# Patient Record
Sex: Male | Born: 1985 | Race: Black or African American | Hispanic: No | Marital: Single | Smoking: Former smoker
Health system: Southern US, Community
[De-identification: ages and names within clinical notes are randomized; demographics above are authoritative.]

## PROBLEM LIST (undated history)

## (undated) DIAGNOSIS — J45909 Unspecified asthma, uncomplicated: Secondary | ICD-10-CM

---

## 2013-03-12 ENCOUNTER — Emergency Department (HOSPITAL_COMMUNITY)
Admission: EM | Admit: 2013-03-12 | Discharge: 2013-03-12 | Disposition: A | Discharge status: Home / Self Care | Payer: BC Managed Care – PPO | Source: Home / Self Care

## 2013-03-12 ENCOUNTER — Encounter (HOSPITAL_COMMUNITY): Payer: Self-pay | Admitting: Emergency Medicine

## 2013-03-12 DIAGNOSIS — J4531 Mild persistent asthma with (acute) exacerbation: Secondary | ICD-10-CM

## 2013-03-12 DIAGNOSIS — J45901 Unspecified asthma with (acute) exacerbation: Secondary | ICD-10-CM

## 2013-03-12 DIAGNOSIS — J209 Acute bronchitis, unspecified: Secondary | ICD-10-CM

## 2013-03-12 HISTORY — DX: Unspecified asthma, uncomplicated: J45.909

## 2013-03-12 MED ORDER — ALBUTEROL SULFATE (5 MG/ML) 0.5% IN NEBU
INHALATION_SOLUTION | RESPIRATORY_TRACT | Status: AC
  Filled 2013-03-12: qty 0.5

## 2013-03-12 MED ORDER — ALBUTEROL SULFATE HFA 108 (90 BASE) MCG/ACT IN AERS: 2.0000 | INHALATION_SPRAY | Freq: Four times a day (QID) | RESPIRATORY_TRACT | Status: DC | PRN

## 2013-03-12 MED ORDER — AZITHROMYCIN 250 MG PO TABS: ORAL_TABLET | ORAL | Status: DC

## 2013-03-12 MED ORDER — BUDESONIDE-FORMOTEROL FUMARATE 80-4.5 MCG/ACT IN AERO: 2.0000 | INHALATION_SPRAY | Freq: Two times a day (BID) | RESPIRATORY_TRACT | Status: DC

## 2013-03-12 MED ORDER — IPRATROPIUM BROMIDE 0.02 % IN SOLN
0.5000 mg | RESPIRATORY_TRACT | Status: DC
  Administered 2013-03-12: 0.5 mg via RESPIRATORY_TRACT

## 2013-03-12 MED ORDER — PREDNISONE (PAK) 10 MG PO TABS: 40.0000 mg | ORAL_TABLET | Freq: Every day | ORAL | Status: AC

## 2013-03-12 MED ORDER — PREDNISONE (PAK) 10 MG PO TABS: 40.0000 mg | ORAL_TABLET | Freq: Every day | ORAL | Status: DC

## 2013-03-12 MED ORDER — ALBUTEROL SULFATE (5 MG/ML) 0.5% IN NEBU
2.5000 mg | INHALATION_SOLUTION | RESPIRATORY_TRACT | Status: DC
  Administered 2013-03-12: 2.5 mg via RESPIRATORY_TRACT

## 2013-03-12 NOTE — ED Notes (Addendum)
Pt c/o asthma flare up onset 4 days Sxs include: productive cough, wheezing, SOB, fevers Denies: pain at the moment, v/n/d... Has not had a PCP in 10 yrs and no meds He is alert and oriented w/no signs of acute distress.

## 2013-03-12 NOTE — ED Provider Notes (Signed)
History     CSN: [id number]  Arrival date & time 03/12/13  1224   None     Chief Complaint  Patient presents with  . Asthma    (Consider location/radiation/quality/duration/timing/severity/associated sxs/prior treatment) Patient is a 27 y.o. male presenting with asthma.  Asthma Associated symptoms include chest pain and shortness of breath.   This is a 27 year old male with a past medical history of asthma who presents with one week of shortness of breath cough wheezing and chest pain. Patient states that he is also having some body aches and it is noted that he has a fever when checked here at urgent care. He states that he has not had an exacerbation of his asthma in over 2 years and does not have any medications at home. Past Medical History  Diagnosis Date  . Asthma     History reviewed. No pertinent past surgical history.  No family history on file.  History  Substance Use Topics  . Smoking status: Current Every Day Smoker -- 0.50 packs/day    Types: Cigarettes  . Smokeless tobacco: Not on file  . Alcohol Use: No      Review of Systems  Constitutional: Positive for fever and fatigue.  HENT: Positive for congestion.   Eyes: Negative.   Respiratory: Positive for cough, chest tightness and shortness of breath.   Cardiovascular: Positive for chest pain.  Gastrointestinal: Negative.   Genitourinary: Negative.   Musculoskeletal: Negative.   Skin: Negative.   Neurological: Negative.   Hematological: Negative.   Psychiatric/Behavioral: Negative.     Allergies  Review of patient's allergies indicates no known allergies.  Home Medications   Current Outpatient Rx  Name  Route  Sig  Dispense  Refill  . albuterol (PROVENTIL HFA;VENTOLIN HFA) 108 (90 BASE) MCG/ACT inhaler   Inhalation   Inhale 2 puffs into the lungs every 6 (six) hours as needed for wheezing.   1 Inhaler   2   . azithromycin (ZITHROMAX Z-PAK) 250 MG tablet      Take 500 mg on the first day  (2 tabs) and then 250 mg (one tab) every day after until complete.   6 each   0   . budesonide-formoterol (SYMBICORT) 80-4.5 MCG/ACT inhaler   Inhalation   Inhale 2 puffs into the lungs 2 (two) times daily.   1 Inhaler   12   . predniSONE (STERAPRED UNI-PAK) 10 MG tablet   Oral   Take 4 tablets (40 mg total) by mouth daily.   12 tablet   0     BP 122/78  Pulse 105  Temp(Src) 100.3 F (37.9 C) (Oral)  Resp 18  SpO2 97%  Physical Exam  Constitutional: He is oriented to person, place, and time. He appears well-developed and well-nourished.  HENT:  Head: Normocephalic and atraumatic.  Eyes: Conjunctivae are normal. Pupils are equal, round, and reactive to light.  Neck: Normal range of motion. Neck supple.  Cardiovascular: Normal rate and regular rhythm.   Pulmonary/Chest: Effort normal. No respiratory distress. He has wheezes. He has no rales.  Abdominal: Soft. Bowel sounds are normal.  Musculoskeletal: Normal range of motion.  Neurological: He is alert and oriented to person, place, and time.  Skin: Skin is warm and dry.  Psychiatric: He has a normal mood and affect. His behavior is normal.    ED Course  Procedures (including critical care time)  Labs Reviewed - No data to display No results found.   1. Asthma exacerbation, mild persistent  2. Acute bronchitis       MDM  I have ordered a DuoNeb for him to be received here.  I have ordered a Z-Pak for acute bronchitis. In addition I've ordered 3 days of prednisone 40 mg daily, Symbicort and an albuterol rescue inhaler.        Calvert Cantor, MD 03/12/13 1352

## 2013-03-27 ENCOUNTER — Encounter (HOSPITAL_COMMUNITY): Payer: Self-pay | Admitting: Emergency Medicine

## 2013-03-27 ENCOUNTER — Emergency Department (INDEPENDENT_AMBULATORY_CARE_PROVIDER_SITE_OTHER): Payer: BC Managed Care – PPO

## 2013-03-27 ENCOUNTER — Emergency Department (INDEPENDENT_AMBULATORY_CARE_PROVIDER_SITE_OTHER)
Admission: EM | Admit: 2013-03-27 | Discharge: 2013-03-27 | Disposition: A | Discharge status: Home / Self Care | Payer: BC Managed Care – PPO | Source: Home / Self Care | Attending: Emergency Medicine | Admitting: Emergency Medicine

## 2013-03-27 DIAGNOSIS — J45901 Unspecified asthma with (acute) exacerbation: Secondary | ICD-10-CM

## 2013-03-27 DIAGNOSIS — J4521 Mild intermittent asthma with (acute) exacerbation: Secondary | ICD-10-CM

## 2013-03-27 DIAGNOSIS — J209 Acute bronchitis, unspecified: Secondary | ICD-10-CM

## 2013-03-27 MED ORDER — ALBUTEROL SULFATE (5 MG/ML) 0.5% IN NEBU
INHALATION_SOLUTION | RESPIRATORY_TRACT | Status: AC
  Filled 2013-03-27: qty 1

## 2013-03-27 MED ORDER — ALBUTEROL SULFATE (5 MG/ML) 0.5% IN NEBU
2.5000 mg | INHALATION_SOLUTION | Freq: Once | RESPIRATORY_TRACT | Status: AC
  Administered 2013-03-27: 2.5 mg via RESPIRATORY_TRACT

## 2013-03-27 MED ORDER — GUAIFENESIN 100 MG/5ML PO LIQD: 100.0000 mg | ORAL | Status: DC | PRN

## 2013-03-27 MED ORDER — METHYLPREDNISOLONE 4 MG PO KIT: PACK | ORAL | Status: DC

## 2013-03-27 NOTE — ED Notes (Signed)
Pt c/o severe chest congestion. Nonproductive cough, runny nose and sneezing x 1 wk.  Denies fever and any other symptoms. Pt has a hx of asthma.   States that he has been using symbcort with mild relief.  Pt is sitting up right/alert with no signs of acute respiratory distress.

## 2013-03-29 NOTE — ED Provider Notes (Signed)
History    CSN: [id number] Arrival date & time 03/27/13  1656  First MD Initiated Contact with Patient 03/27/13 1828     Chief Complaint  Patient presents with  . Nasal Congestion    chest congestion x 1 wk. cough nonproductive. denies any other symptoms.    (Consider location/radiation/quality/duration/timing/severity/associated sxs/prior Treatment) HPI  27 yo bm presented with nasal/chest congestion x one week.  Has hx of asthma and was seen int the urgent care a couple of weeks ago and treated for bronchitis.  States that he did get better but was around a sick coworker and began having a cough, wheezing as well. Denies cp, sob, fever, chills.  Cough is productive with clear sputum.   Past Medical History  Diagnosis Date  . Asthma    History reviewed. No pertinent past surgical history. History reviewed. No pertinent family history. History  Substance Use Topics  . Smoking status: Current Every Day Smoker -- 0.50 packs/day    Types: Cigarettes  . Smokeless tobacco: Not on file  . Alcohol Use: No    Review of Systems  Constitutional: Negative.   HENT: Positive for congestion, rhinorrhea and postnasal drip. Negative for ear pain.   Eyes: Negative.   Respiratory: Positive for cough, chest tightness and wheezing.   Cardiovascular: Negative.   Gastrointestinal: Negative.   Endocrine: Negative.   Genitourinary: Negative.   Musculoskeletal: Negative.   Skin: Negative.   Neurological: Negative.   Hematological: Negative.   Psychiatric/Behavioral: Negative.     Allergies  Review of patient's allergies indicates no known allergies.  Home Medications   Current Outpatient Rx  Name  Route  Sig  Dispense  Refill  . budesonide-formoterol (SYMBICORT) 80-4.5 MCG/ACT inhaler   Inhalation   Inhale 2 puffs into the lungs 2 (two) times daily.   1 Inhaler   12   . albuterol (PROVENTIL HFA;VENTOLIN HFA) 108 (90 BASE) MCG/ACT inhaler   Inhalation   Inhale 2 puffs into the  lungs every 6 (six) hours as needed for wheezing.   1 Inhaler   2   . azithromycin (ZITHROMAX Z-PAK) 250 MG tablet      Take 500 mg on the first day (2 tabs) and then 250 mg (one tab) every day after until complete.   6 each   0   . guaiFENesin (ROBITUSSIN) 100 MG/5ML liquid   Oral   Take 5-10 mLs (100-200 mg total) by mouth every 4 (four) hours as needed for cough.   60 mL   0   . methylPREDNISolone (MEDROL, PAK,) 4 MG tablet      follow package directions   21 tablet   0    BP 139/96  Pulse 95  Temp(Src) 98.9 F (37.2 C) (Oral)  Resp 22  SpO2 97% Physical Exam  Constitutional: He is oriented to person, place, and time. He appears well-nourished.  HENT:  Head: Normocephalic and atraumatic.  Eyes: EOM are normal. Pupils are equal, round, and reactive to light.  Neck: Normal range of motion.  Cardiovascular: Normal rate, regular rhythm and normal heart sounds.   Pulmonary/Chest: No respiratory distress. He has wheezes.  Abdominal: Soft. Bowel sounds are normal.  Musculoskeletal: Normal range of motion.  Neurological: He is alert and oriented to person, place, and time.  Skin: Skin is warm.  Psychiatric: He has a normal mood and affect.    ED Course  Procedures (including critical care time) Labs Reviewed - No data to display Dg Chest 2 View  03/27/2013   *RADIOLOGY REPORT*  Clinical Data: Cough and congestion  CHEST - 2 VIEW  Comparison: None.  Findings: Heart size is normal.  Mediastinal shadows are normal. Lungs are clear.  No effusions.  No bony abnormalities.  IMPRESSION: Normal chest   Original Report Authenticated By: Paulina Fusi, M.D.   1. Asthma exacerbation, mild intermittent     MDM  Patient was given a albuterol neb treatment today.  Stated that he felt much better after.  Will continue using inhalers that urgent care provider gave at last visit.  Given medrol dose pack to use as directed.  Advised that he must get established with a primary care  physician soon.  Will begin looking next week.  F/u with [place] if symptoms worsen or go the the ED.  Voices understanding.    Meds ordered this encounter  Medications  . albuterol (PROVENTIL) (5 MG/ML) 0.5% nebulizer solution 2.5 mg    Sig:   . methylPREDNISolone (MEDROL, PAK,) 4 MG tablet    Sig: follow package directions    Dispense:  21 tablet    Refill:  0  . guaiFENesin (ROBITUSSIN) 100 MG/5ML liquid    Sig: Take 5-10 mLs (100-200 mg total) by mouth every 4 (four) hours as needed for cough.    Dispense:  60 mL    Refill:  0    Zonia Kief, PA-C 03/29/13 1010

## 2013-03-29 NOTE — ED Provider Notes (Signed)
Medical screening examination/treatment/procedure(s) were performed by non-physician practitioner and as supervising physician I was immediately available for consultation/collaboration.  Sadiya Durand, M.D.  Neytiri Asche C Naya Ilagan, MD 03/29/13 2155 

## 2017-06-19 ENCOUNTER — Encounter (HOSPITAL_COMMUNITY): Payer: Self-pay | Admitting: Emergency Medicine

## 2017-06-19 ENCOUNTER — Ambulatory Visit (HOSPITAL_COMMUNITY)
Admission: EM | Admit: 2017-06-19 | Discharge: 2017-06-19 | Disposition: A | Discharge status: Home / Self Care | Payer: 59 | Attending: Family Medicine | Admitting: Family Medicine

## 2017-06-19 DIAGNOSIS — J4521 Mild intermittent asthma with (acute) exacerbation: Secondary | ICD-10-CM

## 2017-06-19 MED ORDER — PREDNISONE 20 MG PO TABS: ORAL_TABLET | ORAL | 0 refills | Status: DC

## 2017-06-19 MED ORDER — ALBUTEROL SULFATE HFA 108 (90 BASE) MCG/ACT IN AERS: 2.0000 | INHALATION_SPRAY | Freq: Four times a day (QID) | RESPIRATORY_TRACT | 11 refills | Status: AC | PRN

## 2017-06-19 MED ORDER — BUDESONIDE-FORMOTEROL FUMARATE 80-4.5 MCG/ACT IN AERO: 2.0000 | INHALATION_SPRAY | Freq: Two times a day (BID) | RESPIRATORY_TRACT | 12 refills | Status: DC

## 2017-06-19 NOTE — Discharge Instructions (Signed)
Follow up with a primary care provider.  

## 2017-06-19 NOTE — ED Provider Notes (Signed)
  [facility]   [id number] 06/19/17 Arrival Time: 1106   SUBJECTIVE:  Michael Haynes is a 31 y.o. male who presents to the urgent care with complaint of upper respiratory symptoms for 4 days.  He is a known asthmatic and has run out of his medicine.  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 He developed a cough followed by scratchy throat and now wheezing.     Past Medical History:  Diagnosis Date  . Asthma    No family history on file. Social History   Social History  . Marital status: Single    Spouse name: N/A  . Number of children: N/A  . Years of education: N/A   Occupational History  . Not on file.   Social History Main Topics  . Smoking status: Current Every Day Smoker    Packs/day: 0.50    Types: Cigarettes  . Smokeless tobacco: Not on file  . Alcohol use No  . Drug use: No  . Sexual activity: Yes    Birth control/ protection: Condom   Other Topics Concern  . Not on file   Social History Narrative  . No narrative on file   Current Meds  Medication Sig  . [DISCONTINUED] guaiFENesin (MUCINEX) 600 MG 12 hr tablet Take by mouth 2 (two) times daily.   No Known Allergies    ROS: As per HPI, remainder of ROS negative.   OBJECTIVE:   Vitals:   06/19/17 1212  BP: (!) 149/82  Pulse: (!) 108  Resp: 20  Temp: 98 F (36.7 C)  TempSrc: Oral  SpO2: 100%     General appearance: alert; no distress Eyes: PERRL; EOMI; conjunctiva normal HENT: normocephalic; atraumatic; TMs normal, canal normal, external ears normal without trauma; nasal mucosa normal; oral mucosa normal Neck: supple Lungs: Wheezes in all lung fields on expiration Heart: regular rate and rhythm Back: no CVA tenderness Extremities: no cyanosis or edema; symmetrical with no gross deformities Skin: warm and dry Neurologic: normal gait; grossly normal Psychological: alert and cooperative; normal mood and  affect      Labs:  No results found for this or any previous visit.  Labs Reviewed - No data to display  No results found.     ASSESSMENT & PLAN:  1. Mild intermittent asthma with acute exacerbation     Meds ordered this encounter  Medications  . DISCONTD: guaiFENesin (MUCINEX) 600 MG 12 hr tablet    Sig: Take by mouth 2 (two) times daily.  . budesonide-formoterol (SYMBICORT) 80-4.5 MCG/ACT inhaler    Sig: Inhale 2 puffs into the lungs 2 (two) times daily.    Dispense:  1 Inhaler    Refill:  12  . albuterol (PROVENTIL HFA;VENTOLIN HFA) 108 (90 Base) MCG/ACT inhaler    Sig: Inhale 2 puffs into the lungs every 6 (six) hours as needed for wheezing.    Dispense:  1 Inhaler    Refill:  11  . predniSONE (DELTASONE) 20 MG tablet    Sig: Two daily with food    Dispense:  10 tablet    Refill:  0    Reviewed expectations re: course of current medical issues. Questions answered. Outlined signs and symptoms indicating need for more acute intervention. Patient verbalized understanding. After Visit Summary given.    Procedures:      Elvina Sidle, MD 06/19/17 1232

## 2017-06-19 NOTE — ED Triage Notes (Signed)
Sinus congestion and chest congestion that started on Saturday.  Patient has a cough, patient has sinus drainage

## 2019-05-02 ENCOUNTER — Other Ambulatory Visit: Payer: Self-pay

## 2019-05-02 ENCOUNTER — Inpatient Hospital Stay (HOSPITAL_COMMUNITY)
Admission: EM | Admit: 2019-05-02 | Discharge: 2019-05-05 | DRG: 292 | Disposition: A | Discharge status: Home / Self Care | Payer: 59 | Attending: Family Medicine | Admitting: Family Medicine

## 2019-05-02 ENCOUNTER — Encounter (HOSPITAL_COMMUNITY): Payer: Self-pay

## 2019-05-02 ENCOUNTER — Emergency Department (HOSPITAL_COMMUNITY): Payer: 59

## 2019-05-02 DIAGNOSIS — E876 Hypokalemia: Secondary | ICD-10-CM | POA: Diagnosis present

## 2019-05-02 DIAGNOSIS — I428 Other cardiomyopathies: Secondary | ICD-10-CM | POA: Diagnosis present

## 2019-05-02 DIAGNOSIS — J90 Pleural effusion, not elsewhere classified: Secondary | ICD-10-CM

## 2019-05-02 DIAGNOSIS — I272 Pulmonary hypertension, unspecified: Secondary | ICD-10-CM | POA: Diagnosis not present

## 2019-05-02 DIAGNOSIS — J45909 Unspecified asthma, uncomplicated: Secondary | ICD-10-CM | POA: Diagnosis present

## 2019-05-02 DIAGNOSIS — Z23 Encounter for immunization: Secondary | ICD-10-CM

## 2019-05-02 DIAGNOSIS — I2721 Secondary pulmonary arterial hypertension: Secondary | ICD-10-CM | POA: Diagnosis present

## 2019-05-02 DIAGNOSIS — J9811 Atelectasis: Secondary | ICD-10-CM | POA: Diagnosis present

## 2019-05-02 DIAGNOSIS — I248 Other forms of acute ischemic heart disease: Secondary | ICD-10-CM | POA: Diagnosis present

## 2019-05-02 DIAGNOSIS — Z713 Dietary counseling and surveillance: Secondary | ICD-10-CM

## 2019-05-02 DIAGNOSIS — R609 Edema, unspecified: Secondary | ICD-10-CM | POA: Diagnosis not present

## 2019-05-02 DIAGNOSIS — Z6841 Body Mass Index (BMI) 40.0 and over, adult: Secondary | ICD-10-CM

## 2019-05-02 DIAGNOSIS — D649 Anemia, unspecified: Secondary | ICD-10-CM | POA: Diagnosis present

## 2019-05-02 DIAGNOSIS — Z7289 Other problems related to lifestyle: Secondary | ICD-10-CM

## 2019-05-02 DIAGNOSIS — R0600 Dyspnea, unspecified: Secondary | ICD-10-CM | POA: Diagnosis not present

## 2019-05-02 DIAGNOSIS — R0602 Shortness of breath: Secondary | ICD-10-CM

## 2019-05-02 DIAGNOSIS — Z87891 Personal history of nicotine dependence: Secondary | ICD-10-CM

## 2019-05-02 DIAGNOSIS — Z8249 Family history of ischemic heart disease and other diseases of the circulatory system: Secondary | ICD-10-CM

## 2019-05-02 DIAGNOSIS — I5041 Acute combined systolic (congestive) and diastolic (congestive) heart failure: Secondary | ICD-10-CM

## 2019-05-02 DIAGNOSIS — Z79899 Other long term (current) drug therapy: Secondary | ICD-10-CM

## 2019-05-02 DIAGNOSIS — E66813 Obesity, class 3: Secondary | ICD-10-CM | POA: Diagnosis present

## 2019-05-02 DIAGNOSIS — Z720 Tobacco use: Secondary | ICD-10-CM

## 2019-05-02 DIAGNOSIS — R911 Solitary pulmonary nodule: Secondary | ICD-10-CM | POA: Diagnosis present

## 2019-05-02 DIAGNOSIS — Z20828 Contact with and (suspected) exposure to other viral communicable diseases: Secondary | ICD-10-CM | POA: Diagnosis present

## 2019-05-02 DIAGNOSIS — I110 Hypertensive heart disease with heart failure: Principal | ICD-10-CM | POA: Diagnosis present

## 2019-05-02 DIAGNOSIS — I509 Heart failure, unspecified: Secondary | ICD-10-CM

## 2019-05-02 LAB — CBC WITH DIFFERENTIAL/PLATELET
Abs Immature Granulocytes: 0.04 10*3/uL (ref 0.00–0.07)
Basophils Absolute: 0.1 10*3/uL (ref 0.0–0.1)
Basophils Relative: 1 %
Eosinophils Absolute: 0.1 10*3/uL (ref 0.0–0.5)
Eosinophils Relative: 1 %
HCT: 35.6 % — ABNORMAL LOW (ref 39.0–52.0)
Hemoglobin: 10.8 g/dL — ABNORMAL LOW (ref 13.0–17.0)
Immature Granulocytes: 0 %
Lymphocytes Relative: 14 %
Lymphs Abs: 1.2 10*3/uL (ref 0.7–4.0)
MCH: 25.7 pg — ABNORMAL LOW (ref 26.0–34.0)
MCHC: 30.3 g/dL (ref 30.0–36.0)
MCV: 84.8 fL (ref 80.0–100.0)
Monocytes Absolute: 0.7 10*3/uL (ref 0.1–1.0)
Monocytes Relative: 8 %
Neutro Abs: 6.8 10*3/uL (ref 1.7–7.7)
Neutrophils Relative %: 76 %
Platelets: 220 10*3/uL (ref 150–400)
RBC: 4.2 MIL/uL — ABNORMAL LOW (ref 4.22–5.81)
RDW: 17.4 % — ABNORMAL HIGH (ref 11.5–15.5)
WBC: 8.9 10*3/uL (ref 4.0–10.5)
nRBC: 0 % (ref 0.0–0.2)

## 2019-05-02 LAB — BASIC METABOLIC PANEL
Anion gap: 13 (ref 5–15)
BUN: 7 mg/dL (ref 6–20)
CO2: 25 mmol/L (ref 22–32)
Calcium: 8.6 mg/dL — ABNORMAL LOW (ref 8.9–10.3)
Chloride: 105 mmol/L (ref 98–111)
Creatinine, Ser: 0.95 mg/dL (ref 0.61–1.24)
GFR calc Af Amer: >60 mL/min (ref >60)
GFR calc non Af Amer: >60 mL/min (ref >60)
Glucose, Bld: 102 mg/dL — ABNORMAL HIGH (ref 70–99)
Potassium: 3.3 mmol/L — ABNORMAL LOW (ref 3.5–5.1)
Sodium: 143 mmol/L (ref 135–145)

## 2019-05-02 LAB — SARS CORONAVIRUS 2 BY RT PCR (HOSPITAL ORDER, PERFORMED IN ~~LOC~~ HOSPITAL LAB): SARS Coronavirus 2: NEGATIVE

## 2019-05-02 LAB — TROPONIN I (HIGH SENSITIVITY)
Troponin I (High Sensitivity): 19 ng/L — ABNORMAL HIGH (ref <18)
Troponin I (High Sensitivity): 19 ng/L — ABNORMAL HIGH (ref <18)

## 2019-05-02 LAB — BRAIN NATRIURETIC PEPTIDE: B Natriuretic Peptide: 439 pg/mL — ABNORMAL HIGH (ref 0.0–100.0)

## 2019-05-02 MED ORDER — LOSARTAN POTASSIUM 25 MG PO TABS
25.0000 mg | ORAL_TABLET | Freq: Every day | ORAL | Status: DC
  Administered 2019-05-02: 25 mg via ORAL
  Filled 2019-05-02: qty 1

## 2019-05-02 MED ORDER — SODIUM CHLORIDE 0.9% FLUSH
3.0000 mL | Freq: Two times a day (BID) | INTRAVENOUS | Status: DC
  Administered 2019-05-02 – 2019-05-05 (×6): 3 mL via INTRAVENOUS

## 2019-05-02 MED ORDER — CARVEDILOL 12.5 MG PO TABS
12.5000 mg | ORAL_TABLET | Freq: Two times a day (BID) | ORAL | Status: DC
  Administered 2019-05-02 – 2019-05-05 (×6): 12.5 mg via ORAL
  Filled 2019-05-02 (×7): qty 1

## 2019-05-02 MED ORDER — SODIUM CHLORIDE 0.9 % IV SOLN
500.0000 mg | Freq: Every day | INTRAVENOUS | Status: DC
  Administered 2019-05-03 – 2019-05-04 (×3): 500 mg via INTRAVENOUS
  Filled 2019-05-02 (×4): qty 500

## 2019-05-02 MED ORDER — POTASSIUM CHLORIDE CRYS ER 20 MEQ PO TBCR
20.0000 meq | EXTENDED_RELEASE_TABLET | Freq: Every day | ORAL | Status: DC
  Administered 2019-05-02 – 2019-05-05 (×4): 20 meq via ORAL
  Filled 2019-05-02 (×4): qty 1

## 2019-05-02 MED ORDER — ENOXAPARIN SODIUM 60 MG/0.6ML ~~LOC~~ SOLN
60.0000 mg | Freq: Every day | SUBCUTANEOUS | Status: DC
  Administered 2019-05-02 – 2019-05-04 (×3): 60 mg via SUBCUTANEOUS
  Filled 2019-05-02 (×3): qty 0.6

## 2019-05-02 MED ORDER — ACETAMINOPHEN 650 MG RE SUPP: 650.0000 mg | Freq: Four times a day (QID) | RECTAL | Status: DC | PRN

## 2019-05-02 MED ORDER — LABETALOL HCL 5 MG/ML IV SOLN
10.0000 mg | Freq: Once | INTRAVENOUS | Status: AC
  Administered 2019-05-02: 10 mg via INTRAVENOUS
  Filled 2019-05-02: qty 4

## 2019-05-02 MED ORDER — IOHEXOL 350 MG/ML SOLN
100.0000 mL | Freq: Once | INTRAVENOUS | Status: AC | PRN
  Administered 2019-05-02: 100 mL via INTRAVENOUS

## 2019-05-02 MED ORDER — SODIUM CHLORIDE (PF) 0.9 % IJ SOLN
INTRAMUSCULAR | Status: AC
  Administered 2019-05-02: 20:00:00
  Filled 2019-05-02: qty 50

## 2019-05-02 MED ORDER — FUROSEMIDE 10 MG/ML IJ SOLN
40.0000 mg | Freq: Once | INTRAMUSCULAR | Status: AC
  Administered 2019-05-02: 40 mg via INTRAVENOUS
  Filled 2019-05-02: qty 4

## 2019-05-02 MED ORDER — POTASSIUM CHLORIDE CRYS ER 20 MEQ PO TBCR
40.0000 meq | EXTENDED_RELEASE_TABLET | Freq: Once | ORAL | Status: AC
  Administered 2019-05-02: 40 meq via ORAL
  Filled 2019-05-02: qty 2

## 2019-05-02 MED ORDER — ACETAMINOPHEN 325 MG PO TABS: 650.0000 mg | ORAL_TABLET | Freq: Four times a day (QID) | ORAL | Status: DC | PRN

## 2019-05-02 MED ORDER — SODIUM CHLORIDE 0.9 % IV SOLN
1.0000 g | Freq: Every day | INTRAVENOUS | Status: DC
  Administered 2019-05-03 – 2019-05-04 (×3): 1 g via INTRAVENOUS
  Filled 2019-05-02 (×2): qty 1
  Filled 2019-05-02: qty 10
  Filled 2019-05-02: qty 1

## 2019-05-02 MED ORDER — POTASSIUM CHLORIDE CRYS ER 20 MEQ PO TBCR: 40.0000 meq | EXTENDED_RELEASE_TABLET | Freq: Once | ORAL | Status: DC

## 2019-05-02 MED ORDER — SODIUM CHLORIDE 0.9 % IV SOLN: 250.0000 mL | INTRAVENOUS | Status: DC | PRN

## 2019-05-02 MED ORDER — FUROSEMIDE 10 MG/ML IJ SOLN
40.0000 mg | Freq: Every day | INTRAMUSCULAR | Status: DC
  Administered 2019-05-02 – 2019-05-03 (×2): 40 mg via INTRAVENOUS
  Filled 2019-05-02: qty 4

## 2019-05-02 MED ORDER — SODIUM CHLORIDE 0.9% FLUSH: 3.0000 mL | INTRAVENOUS | Status: DC | PRN

## 2019-05-02 NOTE — H&P (Addendum)
TRH H&P    Patient Demographics:    Michael Haynes, is a 33 y.o. male  MRN: [id number]  DOB - [birth year]  Admit Date - 05/02/2019  Referring MD/NP/PA: Julianne Rice  Outpatient Primary MD for the patient is Patient, No Pcp Per  Patient coming from:  home  Chief complaint- dyspnea   HPI:    Michael Haynes  is a 33 y.o. male, w morbid obeisity, asthma, presents with c/o dyspnea for several months, as well as lower extremity edema.  Slight cough, mostly dry but sometimes clear sputum.  Pt denies any foamy urine.  Denies fever, chills, cp, palp, n/v, diarrhea, brbpr, black stool, dysuria, hematuria.   In ED,   T 98.4, P 121 R 16, Bp 176/119 Pox 96% on RA Wt 158.8kg  CTA chest IMPRESSION: 1. No demonstrable pulmonary embolus. No thoracic aortic aneurysm or dissection.  2. Enlarged main pulmonary outflow tract, a finding indicative of pulmonary arterial hypertension.  3. Cardiomegaly. Reflux of contrast into the inferior vena cava and hepatic veins may well represent a degree of increase in right heart pressure.  4.  Right pleural effusion.  Right base atelectasis.  5. 2.1 x 2.1 cm nodular appearing opacity abutting the pleura in the right lower lobe. Suspect rounded atelectasis. Atypical pneumonia is a differential consideration. Neoplasm is a third differential consideration. Consider one of the following in 3 months for both low-risk and high-risk individuals: (a) repeat chest CT, (b) follow-up PET-CT, or (c) tissue sampling. This recommendation follows the consensus statement: Guidelines for Management of Incidental Pulmonary Nodules Detected on CT Images: From the Fleischner Society 2017; Radiology 2017; 284:228-243. It may well be reasonable to proceed with PET-CT at this time given this nodular opacity as well as prominent right pretracheal lymph node.  6. Mildly enlarged  right pretracheal lymph node. No other adenopathy by size criteria.  Na 143, K 3.3 Bun 7, Creatinne 0.95   Glucose 102  Wbc 8.9, Hgb 10.8, Plt 220 Mcv 84.8, Rdw 17.4,   Trop 19 BNP 439.0  Trop 19-> 19  Pt will be admitted for dyspnea, cap, pulmonary nodule,  tachycardia, hypertension uncontrolled, and lower ext edema.    Review of systems:    In addition to the HPI above,  No Fever-chills, No Headache, No changes with Vision or hearing, No problems swallowing food or Liquids, No Chest pain, No Abdominal pain, No Nausea or Vomiting, bowel movements are regular, No Blood in stool or Urine, No dysuria, No new skin rashes or bruises, No new joints pains-aches,  No new weakness, tingling, numbness in any extremity, No recent weight gain or loss, No polyuria, polydypsia or polyphagia, No significant Mental Stressors.  All other systems reviewed and are negative.    Past History of the following :    Past Medical History:  Diagnosis Date  . Asthma       History reviewed. No pertinent surgical history.    Social History:      Social History   Tobacco Use  . Smoking status: Former Smoker  Packs/day: 0.00    Types: Cigarettes    Quit date: 04/04/2019    Years since quitting: 0.0  . Smokeless tobacco: Never Used  Substance Use Topics  . Alcohol use: Yes    Alcohol/week: 6.0 standard drinks    Types: 6 Cans of beer per week    Frequency: Never       Family History :     Family History  Problem Relation Age of Onset  . Hypertension Father   . Diabetes Father        Home Medications:   Prior to Admission medications   Medication Sig Start Date End Date Taking? Authorizing Provider  albuterol (PROVENTIL HFA;VENTOLIN HFA) 108 (90 Base) MCG/ACT inhaler Inhale 2 puffs into the lungs every 6 (six) hours as needed for wheezing. 06/19/17  Yes Elvina SidleLauenstein, Kurt, MD  albuterol (PROVENTIL) (2.5 MG/3ML) 0.083% nebulizer solution Take 3 mLs by nebulization  every 6 (six) hours as needed for shortness of breath or wheezing. 03/09/19  Yes [provider]     Allergies:    No Known Allergies   Physical Exam:   Vitals  Blood pressure (!) 179/135, pulse (!) 119, temperature 98.8 F (37.1 C), resp. rate 17, height 5\' 11"  (1.803 m), weight (!) 158.8 kg, SpO2 96 %.  1.  General: axoxo3  2. Psychiatric: euthymic  3. Neurologic: cn2-12 intact, reflexes 2+ symmetric, diffuse, with no clonus, motor 5/5 in all 4 ext  4. HEENMT:  Anicteric, pupils 1.765mm symmetric, direct, consensual, near intact  5. Respiratory : CTAB  6. Cardiovascular : rrr s1, s2, loud P2, no m/g/r  7. Gastrointestinal:  Abd: soft, morbidly obese, nt, nd, +bs  8. Skin:  Ext: no c/c   1+ edema,  No rash  9.Musculoskeletal:  Good ROM,  No adenopathy    Data Review:    CBC Recent Labs  Lab 05/02/19 1618  WBC 8.9  HGB 10.8*  HCT 35.6*  PLT 220  MCV 84.8  MCH 25.7*  MCHC 30.3  RDW 17.4*  LYMPHSABS 1.2  MONOABS 0.7  EOSABS 0.1  BASOSABS 0.1   ------------------------------------------------------------------------------------------------------------------  Results for orders placed or performed during the hospital encounter of 05/02/19 (from the past 48 hour(s))  Basic metabolic panel     Status: Abnormal   Collection Time: 05/02/19  4:18 PM  Result Value Ref Range   Sodium 143 135 - 145 mmol/L   Potassium 3.3 (L) 3.5 - 5.1 mmol/L   Chloride 105 98 - 111 mmol/L   CO2 25 22 - 32 mmol/L   Glucose, Bld 102 (H) 70 - 99 mg/dL   BUN 7 6 - 20 mg/dL   Creatinine, Ser 1.610.95 0.61 - 1.24 mg/dL   Calcium 8.6 (L) 8.9 - 10.3 mg/dL   GFR calc non Af Amer >60 >60 mL/min   GFR calc Af Amer >60 >60 mL/min   Anion gap 13 5 - 15    Comment: Performed at [facility], [address] [place], [place] [address]  CBC with Differential     Status: Abnormal   Collection Time: 05/02/19  4:18 PM  Result Value Ref Range   WBC 8.9 4.0 - 10.5  K/uL   RBC 4.20 (L) 4.22 - 5.81 MIL/uL   Hemoglobin 10.8 (L) 13.0 - 17.0 g/dL   HCT 54.035.6 (L) 98.139.0 - 19.152.0 %   MCV 84.8 80.0 - 100.0 fL   MCH 25.7 (L) 26.0 - 34.0 pg   MCHC 30.3 30.0 - 36.0  g/dL   RDW 69.617.4 (H) 29.511.5 - 28.415.5 %   Platelets 220 150 - 400 K/uL   nRBC 0.0 0.0 - 0.2 %   Neutrophils Relative % 76 %   Neutro Abs 6.8 1.7 - 7.7 K/uL   Lymphocytes Relative 14 %   Lymphs Abs 1.2 0.7 - 4.0 K/uL   Monocytes Relative 8 %   Monocytes Absolute 0.7 0.1 - 1.0 K/uL   Eosinophils Relative 1 %   Eosinophils Absolute 0.1 0.0 - 0.5 K/uL   Basophils Relative 1 %   Basophils Absolute 0.1 0.0 - 0.1 K/uL   Immature Granulocytes 0 %   Abs Immature Granulocytes 0.04 0.00 - 0.07 K/uL    Comment: Performed at [facility], [address] [place], [place] [address]  Troponin I (High Sensitivity)     Status: Abnormal   Collection Time: 05/02/19  4:18 PM  Result Value Ref Range   Troponin I (High Sensitivity) 19 (H) <18 ng/L    Comment: (NOTE) Elevated high sensitivity troponin I (hsTnI) values and significant  changes across serial measurements may suggest ACS but many other  chronic and acute conditions are known to elevate hsTnI results.  Refer to the "Links" section for chest pain algorithms and additional  guidance. Performed at [facility], [address] [place], [place] [address]   Brain natriuretic peptide     Status: Abnormal   Collection Time: 05/02/19  4:18 PM  Result Value Ref Range   B Natriuretic Peptide 439.0 (H) 0.0 - 100.0 pg/mL    Comment: Performed at [facility], [address] [place], [place] [address]  Troponin I (High Sensitivity)     Status: Abnormal   Collection Time: 05/02/19  6:10 PM  Result Value Ref Range   Troponin I (High Sensitivity) 19 (H) <18 ng/L    Comment: (NOTE) Elevated high sensitivity troponin I (hsTnI) values and significant  changes across serial measurements may suggest ACS but many  other  chronic and acute conditions are known to elevate hsTnI results.  Refer to the "Links" section for chest pain algorithms and additional  guidance. Performed at [facility], [address] [place], [place] [address]   SARS Coronavirus 2 [facility](Hospital order, Performed in [facility] hospital lab) Nasopharyngeal Nasopharyngeal Swab     Status: None   Collection Time: 05/02/19  6:47 PM   Specimen: Nasopharyngeal Swab  Result Value Ref Range   SARS Coronavirus 2 NEGATIVE NEGATIVE    Comment: (NOTE) If result is NEGATIVE SARS-CoV-2 target nucleic acids are NOT DETECTED. The SARS-CoV-2 RNA is generally detectable in upper and lower  respiratory specimens during the acute phase of infection. The lowest  concentration of SARS-CoV-2 viral copies this assay can detect is 250  copies / mL. A negative result does not preclude SARS-CoV-2 infection  and should not be used as the sole basis for treatment or other  patient management decisions.  A negative result may occur with  improper specimen collection / handling, submission of specimen other  than nasopharyngeal swab, presence of viral mutation(s) within the  areas targeted by this assay, and inadequate number of viral copies  (<250 copies / mL). A negative result must be combined with clinical  observations, patient history, and epidemiological information. If result is POSITIVE SARS-CoV-2 target nucleic acids are DETECTED. The SARS-CoV-2 RNA is generally detectable in upper and lower  respiratory specimens dur ing the acute phase of infection.  Positive  results are indicative of active infection with SARS-CoV-2.  Clinical  correlation with patient history and other diagnostic information is  necessary to determine patient infection status.  Positive results do  not rule out bacterial infection or co-infection with other viruses. If result is PRESUMPTIVE POSTIVE SARS-CoV-2 nucleic acids MAY BE PRESENT.   A  presumptive positive result was obtained on the submitted specimen  and confirmed on repeat testing.  While 2019 novel coronavirus  (SARS-CoV-2) nucleic acids may be present in the submitted sample  additional confirmatory testing may be necessary for epidemiological  and / or clinical management purposes  to differentiate between  SARS-CoV-2 and other Sarbecovirus currently known to infect humans.  If clinically indicated additional testing with an alternate test  methodology [phone]) is advised. The SARS-CoV-2 RNA is generally  detectable in upper and lower respiratory sp ecimens during the acute  phase of infection. The expected result is Negative. Fact Sheet for Patients:  BoilerBrush.com.cy Fact Sheet for Healthcare Providers: https://pope.com/ This test is not yet approved or cleared by the [place] FDA and has been authorized for detection and/or diagnosis of SARS-CoV-2 by FDA under an Emergency Use Authorization (EUA).  This EUA will remain in effect (meaning this test can be used) for the duration of the COVID-19 declaration under Section 564(b)(1) of the Act, 21 U.S.C. section 360bbb-3(b)(1), unless the authorization is terminated or revoked sooner. Performed at [facility], [address], [place], [place] [address]     Chemistries  Recent Labs  Lab 05/02/19 1618  NA 143  K 3.3*  CL 105  CO2 25  GLUCOSE 102*  BUN 7  CREATININE 0.95  CALCIUM 8.6*   ------------------------------------------------------------------------------------------------------------------  ------------------------------------------------------------------------------------------------------------------ GFR: Estimated Creatinine Clearance: 168.5 mL/min (by C-G formula based on SCr of 0.95 mg/dL). Liver Function Tests: No results for input(s): AST, ALT, ALKPHOS, BILITOT, PROT, ALBUMIN in the last 168 hours. No results  for input(s): LIPASE, AMYLASE in the last 168 hours. No results for input(s): AMMONIA in the last 168 hours. Coagulation Profile: No results for input(s): INR, PROTIME in the last 168 hours. Cardiac Enzymes: No results for input(s): CKTOTAL, CKMB, CKMBINDEX, TROPONINI in the last 168 hours. BNP (last 3 results) No results for input(s): PROBNP in the last 8760 hours. HbA1C: No results for input(s): HGBA1C in the last 72 hours. CBG: No results for input(s): GLUCAP in the last 168 hours. Lipid Profile: No results for input(s): CHOL, HDL, LDLCALC, TRIG, CHOLHDL, LDLDIRECT in the last 72 hours. Thyroid Function Tests: No results for input(s): TSH, T4TOTAL, FREET4, T3FREE, THYROIDAB in the last 72 hours. Anemia Panel: No results for input(s): VITAMINB12, FOLATE, FERRITIN, TIBC, IRON, RETICCTPCT in the last 72 hours.  --------------------------------------------------------------------------------------------------------------- Urine analysis: No results found for: COLORURINE, APPEARANCEUR, LABSPEC, PHURINE, GLUCOSEU, HGBUR, BILIRUBINUR, KETONESUR, PROTEINUR, UROBILINOGEN, NITRITE, LEUKOCYTESUR    Imaging Results:    Dg Chest 2 View  Result Date: 05/02/2019 CLINICAL DATA:  Cough and shortness of breath.  Left arm tingling. EXAM: CHEST - 2 VIEW COMPARISON:  03/27/2013 FINDINGS: Cardiac silhouette is mildly enlarged. No mediastinal or hilar masses. No evidence of adenopathy. Lungs are clear.  No pleural effusion or pneumothorax. Skeletal structures are unremarkable. IMPRESSION: 1. No acute cardiopulmonary disease. 2. Mild cardiomegaly. Electronically Signed   By: Amie Portland M.D.   On: 05/02/2019 16:51   Ct Angio Chest Pe W And/or Wo Contrast  Result Date: 05/02/2019 CLINICAL DATA:  Tachycardia and shortness of breath EXAM: CT ANGIOGRAPHY CHEST WITH CONTRAST TECHNIQUE:  Multidetector CT imaging of the chest was performed using the standard protocol during bolus administration of intravenous  contrast. Multiplanar CT image reconstructions and MIPs were obtained to evaluate the vascular anatomy. CONTRAST:  100mL OMNIPAQUE IOHEXOL 350 MG/ML SOLN COMPARISON:  Chest radiograph May 02, 2019 FINDINGS: Cardiovascular: There is no demonstrable pulmonary embolus. There is no thoracic aortic aneurysm or dissection. The visualized great vessels appear normal. There is cardiomegaly. There is no pericardial effusion or pericardial thickening. The main pulmonary outflow tract measures 3.6 cm, abnormally prominent. Mediastinum/Nodes: Visualized thyroid appears unremarkable. There are multiple subcentimeter mediastinal lymph nodes. There is a right pretracheal lymph node measuring 1.2 x 1.2 cm. No esophageal lesions are evident. Lungs/Pleura: There is a moderate free-flowing right pleural effusion. There is atelectatic change in the right base. There is a nodular appearing opacity abutting the pleura in the right lower lobe measuring 2.1 x 2.1 cm, best appreciated on axial slice 79 series 6, coronal slice 125 series 9, and sagittal slice 63 series 10. right lung is clear. Upper Abdomen: There is reflux of contrast into the inferior vena cava and hepatic veins. Visualized upper abdominal structures otherwise appear unremarkable. Musculoskeletal: No blastic or lytic bone lesions. No evident chest wall lesions. Review of the MIP images confirms the above findings. IMPRESSION: 1. No demonstrable pulmonary embolus. No thoracic aortic aneurysm or dissection. 2. Enlarged main pulmonary outflow tract, a finding indicative of pulmonary arterial hypertension. 3. Cardiomegaly. Reflux of contrast into the inferior vena cava and hepatic veins may well represent a degree of increase in right heart pressure. 4.  Right pleural effusion.  Right base atelectasis. 5. 2.1 x 2.1 cm nodular appearing opacity abutting the pleura in the right lower lobe. Suspect rounded atelectasis. Atypical pneumonia is a differential consideration.  Neoplasm is a third differential consideration. Consider one of the following in 3 months for both low-risk and high-risk individuals: (a) repeat chest CT, (b) follow-up PET-CT, or (c) tissue sampling. This recommendation follows the consensus statement: Guidelines for Management of Incidental Pulmonary Nodules Detected on CT Images: From the Fleischner Society 2017; Radiology 2017; 284:228-243. It may well be reasonable to proceed with PET-CT at this time given this nodular opacity as well as prominent right pretracheal lymph node. 6. Mildly enlarged right pretracheal lymph node. No other adenopathy by size criteria. Electronically Signed   By: Bretta BangWilliam  Woodruff III M.D.   On: 05/02/2019 20:01   ekg st at 120, nl axis, nl int, no st-t changes c/w ischemia   Assessment & Plan:    Principal Problem:   Dyspnea Active Problems:   Edema   Pulmonary hypertension (HCC)   Asthma  Dyspnea secondary to pulmonary hypertension ddx cardiomyopathy Check cardiac echo   Elevated troponin Check trop in am Check cardiac echo Cardiology consult placed in computer ED spoke with cardiology and says that they will consult ? Appreciate input  Pulmonary hypertension (loud p2) Check cardiac echo   Edema ? lymphedema Check LFT Check urinalysis Check TSH Check LE ultrasound r/o DVT Check cardiac echo  Lasix 40mg  iv qday Kcl 20 meq po qday  Hypokalemia Replete Check cmp in am  RLL nodule density ? CAP, atelectasis , less likely malignancy Blood culture x2 Urine strep antigen Urine legionella antigen Rocephin 1gm iv qday Zithromax 500mg  iv qday NOTE, needs repeat CT chest in 3months f/u of lung nodule  R pleural effusion Consider f/u CXR  Hypertension uncontrolled Start carvedilol 12.5mg  po bid Start lasix 40mg  iv qday Start losartan 25mg   po qday  DVT Prophylaxis-   Lovenox - SCDs   AM Labs Ordered, also please review Full Orders  Family Communication: Admission, patients condition and  plan of care including tests being ordered have been discussed with the patient  who indicate understanding and agree with the plan and Code Status.  Code Status:  FULL CODE, spoke with his spouse and let her know pt admitted to [facility]    Admission status: Observation: Based on patients clinical presentation and evaluation of above clinical data, I have made determination that patient meets observation criteria at this time.     Time spent in minutes : 70   Pearson Grippe M.D on 05/02/2019 at 9:33 PM

## 2019-05-02 NOTE — ED Triage Notes (Addendum)
Pt states that he has hx of "cardiac asthma". Pt states that he is coughing up mucous. Pt also describes tingling and numbness in left arm. Pt also states he feels his heart racing.

## 2019-05-02 NOTE — ED Provider Notes (Signed)
[facility] DEPT Provider Note   CSN: [id number] Arrival date & time: 05/02/19  1557    History   Chief Complaint Chief Complaint  Patient presents with   Cough   Numbness    HPI Michael Haynes is a 33 y.o. male.     HPI Patient with several months of increasing lower extremity swelling, dyspnea and cough productive of clear sputum.  States he occasionally has some chest tightness.  Currently denying any chest pain.  States he did have a low-grade fever several days ago.  No known Covid-19 exposure. Past Medical History:  Diagnosis Date   Asthma     Patient Active Problem List   Diagnosis Date Noted   Edema 05/02/2019   Dyspnea 05/02/2019   Pulmonary hypertension ([place]) 05/02/2019   Asthma 05/02/2019   Obesity, Class III, BMI 40-49.9 (morbid obesity) ([place]) 05/02/2019    History reviewed. No pertinent surgical history.      Home Medications    Prior to Admission medications   Medication Sig Start Date End Date Taking? Authorizing Provider  albuterol (PROVENTIL HFA;VENTOLIN HFA) 108 (90 Base) MCG/ACT inhaler Inhale 2 puffs into the lungs every 6 (six) hours as needed for wheezing. 06/19/17  Yes Robyn Haber, MD  albuterol (PROVENTIL) (2.5 MG/3ML) 0.083% nebulizer solution Take 3 mLs by nebulization every 6 (six) hours as needed for shortness of breath or wheezing. 03/09/19  Yes [provider]    Family History Family History  Problem Relation Age of Onset   Hypertension Father    Diabetes Father     Social History Social History   Tobacco Use   Smoking status: Former Smoker    Packs/day: 0.00    Types: Cigarettes    Quit date: 04/04/2019    Years since quitting: 0.0   Smokeless tobacco: Never Used  Substance Use Topics   Alcohol use: Yes    Alcohol/week: 6.0 standard drinks    Types: 6 Cans of beer per week    Frequency: Never   Drug use: No     Allergies   Patient has no known  allergies.   Review of Systems Review of Systems  Constitutional: Positive for chills, fatigue and fever.  HENT: Negative for sore throat and trouble swallowing.   Eyes: Negative for visual disturbance.  Respiratory: Positive for cough, chest tightness and shortness of breath.   Cardiovascular: Positive for palpitations and leg swelling. Negative for chest pain.  Gastrointestinal: Negative for abdominal pain, constipation, diarrhea, nausea and vomiting.  Musculoskeletal: Negative for back pain, myalgias and neck pain.  Skin: Negative for rash and wound.  Neurological: Negative for dizziness, weakness, light-headedness, numbness and headaches.  All other systems reviewed and are negative.    Physical Exam Updated Vital Signs BP (!) 179/135    Pulse (!) 119    Temp 98.8 F (37.1 C)    Resp 17    Ht 5\' 11"  (1.803 m)    Wt (!) 158.8 kg    SpO2 96%    BMI 48.82 kg/m   Physical Exam Vitals signs and nursing note reviewed.  Constitutional:      Appearance: Normal appearance. He is well-developed.  HENT:     Head: Normocephalic and atraumatic.     Nose: Nose normal.     Mouth/Throat:     Mouth: Mucous membranes are moist.  Eyes:     Pupils: Pupils are equal, round, and reactive to light.  Neck:     Musculoskeletal: Normal range  of motion and neck supple. No neck rigidity or muscular tenderness.     Vascular: No carotid bruit.  Cardiovascular:     Rate and Rhythm: Regular rhythm. Tachycardia present.     Heart sounds: No murmur. No friction rub. No gallop.   Pulmonary:     Effort: No respiratory distress.     Breath sounds: Normal breath sounds. No stridor. No wheezing, rhonchi or rales.     Comments: Mild increased respiratory effort Chest:     Chest wall: No tenderness.  Abdominal:     General: Bowel sounds are normal.     Palpations: Abdomen is soft.     Tenderness: There is no abdominal tenderness. There is no right CVA tenderness, left CVA tenderness, guarding or  rebound.  Musculoskeletal: Normal range of motion.        General: Swelling present. No tenderness.     Right lower leg: Edema present.     Left lower leg: Edema present.     Comments: Bilateral lower extremity edema superior to the knees.  Lymphadenopathy:     Cervical: No cervical adenopathy.  Skin:    General: Skin is warm and dry.     Capillary Refill: Capillary refill takes less than 2 seconds.     Findings: No erythema or rash.  Neurological:     General: No focal deficit present.     Mental Status: He is alert and oriented to person, place, and time.  Psychiatric:        Behavior: Behavior normal.      ED Treatments / Results  Labs (all labs ordered are listed, but only abnormal results are displayed) Labs Reviewed  BASIC METABOLIC PANEL - Abnormal; Notable for the following components:      Result Value   Potassium 3.3 (*)    Glucose, Bld 102 (*)    Calcium 8.6 (*)    All other components within normal limits  CBC WITH DIFFERENTIAL/PLATELET - Abnormal; Notable for the following components:   RBC 4.20 (*)    Hemoglobin 10.8 (*)    HCT 35.6 (*)    MCH 25.7 (*)    RDW 17.4 (*)    All other components within normal limits  BRAIN NATRIURETIC PEPTIDE - Abnormal; Notable for the following components:   B Natriuretic Peptide 439.0 (*)    All other components within normal limits  TROPONIN I (HIGH SENSITIVITY) - Abnormal; Notable for the following components:   Troponin I (High Sensitivity) 19 (*)    All other components within normal limits  TROPONIN I (HIGH SENSITIVITY) - Abnormal; Notable for the following components:   Troponin I (High Sensitivity) 19 (*)    All other components within normal limits  SARS CORONAVIRUS 2 (HOSPITAL ORDER, PERFORMED IN [facility])  MAGNESIUM  HIV ANTIBODY (ROUTINE TESTING W REFLEX)  COMPREHENSIVE METABOLIC PANEL  CBC  TSH    EKG EKG Interpretation  Date/Time:  Sunday May 02 2019 16:04:11 EDT Ventricular Rate:   123 PR Interval:    QRS Duration: 98 QT Interval:  434 QTC Calculation: 621 R Axis:   27 Text Interpretation:  Sinus tachycardia Otherwise normal ECG Confirmed by Loren Racer ([id number]) on 05/02/2019 6:31:52 PM   Radiology Dg Chest 2 View  Result Date: 05/02/2019 CLINICAL DATA:  Cough and shortness of breath.  Left arm tingling. EXAM: CHEST - 2 VIEW COMPARISON:  03/27/2013 FINDINGS: Cardiac silhouette is mildly enlarged. No mediastinal or hilar masses. No evidence of adenopathy. Lungs are  clear.  No pleural effusion or pneumothorax. Skeletal structures are unremarkable. IMPRESSION: 1. No acute cardiopulmonary disease. 2. Mild cardiomegaly. Electronically Signed   By: Amie Portlandavid  Ormond M.D.   On: 05/02/2019 16:51   Ct Angio Chest Pe W And/or Wo Contrast  Result Date: 05/02/2019 CLINICAL DATA:  Tachycardia and shortness of breath EXAM: CT ANGIOGRAPHY CHEST WITH CONTRAST TECHNIQUE: Multidetector CT imaging of the chest was performed using the standard protocol during bolus administration of intravenous contrast. Multiplanar CT image reconstructions and MIPs were obtained to evaluate the vascular anatomy. CONTRAST:  100mL OMNIPAQUE IOHEXOL 350 MG/ML SOLN COMPARISON:  Chest radiograph May 02, 2019 FINDINGS: Cardiovascular: There is no demonstrable pulmonary embolus. There is no thoracic aortic aneurysm or dissection. The visualized great vessels appear normal. There is cardiomegaly. There is no pericardial effusion or pericardial thickening. The main pulmonary outflow tract measures 3.6 cm, abnormally prominent. Mediastinum/Nodes: Visualized thyroid appears unremarkable. There are multiple subcentimeter mediastinal lymph nodes. There is a right pretracheal lymph node measuring 1.2 x 1.2 cm. No esophageal lesions are evident. Lungs/Pleura: There is a moderate free-flowing right pleural effusion. There is atelectatic change in the right base. There is a nodular appearing opacity abutting the pleura in the  right lower lobe measuring 2.1 x 2.1 cm, best appreciated on axial slice 79 series 6, coronal slice 125 series 9, and sagittal slice 63 series 10. right lung is clear. Upper Abdomen: There is reflux of contrast into the inferior vena cava and hepatic veins. Visualized upper abdominal structures otherwise appear unremarkable. Musculoskeletal: No blastic or lytic bone lesions. No evident chest wall lesions. Review of the MIP images confirms the above findings. IMPRESSION: 1. No demonstrable pulmonary embolus. No thoracic aortic aneurysm or dissection. 2. Enlarged main pulmonary outflow tract, a finding indicative of pulmonary arterial hypertension. 3. Cardiomegaly. Reflux of contrast into the inferior vena cava and hepatic veins may well represent a degree of increase in right heart pressure. 4.  Right pleural effusion.  Right base atelectasis. 5. 2.1 x 2.1 cm nodular appearing opacity abutting the pleura in the right lower lobe. Suspect rounded atelectasis. Atypical pneumonia is a differential consideration. Neoplasm is a third differential consideration. Consider one of the following in 3 months for both low-risk and high-risk individuals: (a) repeat chest CT, (b) follow-up PET-CT, or (c) tissue sampling. This recommendation follows the consensus statement: Guidelines for Management of Incidental Pulmonary Nodules Detected on CT Images: From the Fleischner Society 2017; Radiology 2017; 284:228-243. It may well be reasonable to proceed with PET-CT at this time given this nodular opacity as well as prominent right pretracheal lymph node. 6. Mildly enlarged right pretracheal lymph node. No other adenopathy by size criteria. Electronically Signed   By: Bretta BangWilliam  Woodruff III M.D.   On: 05/02/2019 20:01    Procedures Procedures (including critical care time)  Medications Ordered in ED Medications  enoxaparin (LOVENOX) injection 40 mg (has no administration in time range)  sodium chloride flush (NS) 0.9 %  injection 3 mL (has no administration in time range)  sodium chloride flush (NS) 0.9 % injection 3 mL (has no administration in time range)  0.9 %  sodium chloride infusion (has no administration in time range)  acetaminophen (TYLENOL) tablet 650 mg (has no administration in time range)    Or  acetaminophen (TYLENOL) suppository 650 mg (has no administration in time range)  furosemide (LASIX) injection 40 mg (has no administration in time range)  potassium chloride SA (K-DUR) CR tablet 20 mEq (has no administration  in time range)  iohexol (OMNIPAQUE) 350 MG/ML injection 100 mL (100 mLs Intravenous Contrast Given 05/02/19 1940)  sodium chloride (PF) 0.9 % injection (  Given by Other 05/02/19 1950)  labetalol (NORMODYNE) injection 10 mg (10 mg Intravenous Given 05/02/19 2113)  furosemide (LASIX) injection 40 mg (40 mg Intravenous Given 05/02/19 2113)  potassium chloride SA (K-DUR) CR tablet 40 mEq (40 mEq Oral Given 05/02/19 2113)     Initial Impression / Assessment and Plan / ED Course  I have reviewed the triage vital signs and the nursing notes.  Pertinent labs & imaging results that were available during my care of the patient were reviewed by me and considered in my medical decision making (see chart for details).       Discussed with cardiology who recommends IV Lasix, IV labetalol and admission to hospitalist.  They will see patient in the morning.   Final Clinical Impressions(s) / ED Diagnoses   Final diagnoses:  Dyspnea, unspecified type    ED Discharge Orders    None       Loren RacerYelverton, Kobi Aller, MD 05/02/19 2153

## 2019-05-02 NOTE — ED Provider Notes (Signed)
MSE was initiated and I personally evaluated the patient and placed orders (if any) at  4:08 PM on May 02, 2019.  The patient appears stable so that the remainder of the MSE may be completed by another provider.  Patient placed in Quick Look pathway, seen and evaluated   Chief Complaint: Cough, shortness of breath, "tingling" left arm  HPI:   Patient is a 33 yo with a PMH of asthma presenting for ongoing cough x several weeks, mucous production, shortness of breath. Reports he was told in telemedicine that he may have "cardiac asthma."  No VTE history.   ROS:  +Shortness of breath, cough, paresthesias left arm - Fever, chills, hemoptysis, chest pain, wheezing.  Physical Exam:   Gen: Obese. No distress  Neuro: Awake and Alert  Skin: Warm    Focused Exam: Tachycardic. Speaking in full sentences in no respiratory distress. Clear lungs.   Initiation of care has begun. The patient has been counseled on the process, plan, and necessity for staying for the completion/evaluation, and the remainder of the medical screening examination    Tamala Julian 05/02/19 [place], Ankit, MD 05/05/19 [phone]

## 2019-05-02 NOTE — Progress Notes (Signed)
Lovenox per Pharmacy for DVT Prophylaxis    Pharmacy has been consulted from dosing enoxaparin (lovenox) in this patient for DVT prophylaxis.  The pharmacist has reviewed pertinent labs (Hgb _10.8__; PLT__220_), patient weight (_158__kg) and renal function (CrCl_>90__mL/min) and decided that enoxaparin _60_mg SQ Q24Hrs is appropriate for this patient.  The pharmacy department will sign off at this time.  Please reconsult pharmacy if status changes or for further issues.  Thank you  Cyndia Diver PharmD, BCPS  05/02/2019, 10:30 PM

## 2019-05-03 ENCOUNTER — Other Ambulatory Visit: Payer: Self-pay

## 2019-05-03 ENCOUNTER — Encounter (HOSPITAL_COMMUNITY): Payer: Self-pay | Admitting: Cardiology

## 2019-05-03 ENCOUNTER — Observation Stay (HOSPITAL_BASED_OUTPATIENT_CLINIC_OR_DEPARTMENT_OTHER): Payer: 59

## 2019-05-03 ENCOUNTER — Observation Stay (HOSPITAL_COMMUNITY): Payer: 59

## 2019-05-03 DIAGNOSIS — I428 Other cardiomyopathies: Secondary | ICD-10-CM | POA: Diagnosis present

## 2019-05-03 DIAGNOSIS — I5021 Acute systolic (congestive) heart failure: Secondary | ICD-10-CM

## 2019-05-03 DIAGNOSIS — R0600 Dyspnea, unspecified: Secondary | ICD-10-CM

## 2019-05-03 DIAGNOSIS — Z8249 Family history of ischemic heart disease and other diseases of the circulatory system: Secondary | ICD-10-CM | POA: Diagnosis not present

## 2019-05-03 DIAGNOSIS — I110 Hypertensive heart disease with heart failure: Secondary | ICD-10-CM | POA: Diagnosis present

## 2019-05-03 DIAGNOSIS — I2721 Secondary pulmonary arterial hypertension: Secondary | ICD-10-CM | POA: Diagnosis present

## 2019-05-03 DIAGNOSIS — Z6841 Body Mass Index (BMI) 40.0 and over, adult: Secondary | ICD-10-CM | POA: Diagnosis not present

## 2019-05-03 DIAGNOSIS — D649 Anemia, unspecified: Secondary | ICD-10-CM | POA: Diagnosis present

## 2019-05-03 DIAGNOSIS — Z79899 Other long term (current) drug therapy: Secondary | ICD-10-CM | POA: Diagnosis not present

## 2019-05-03 DIAGNOSIS — R609 Edema, unspecified: Secondary | ICD-10-CM | POA: Diagnosis not present

## 2019-05-03 DIAGNOSIS — R0609 Other forms of dyspnea: Secondary | ICD-10-CM

## 2019-05-03 DIAGNOSIS — Z23 Encounter for immunization: Secondary | ICD-10-CM | POA: Diagnosis not present

## 2019-05-03 DIAGNOSIS — Z713 Dietary counseling and surveillance: Secondary | ICD-10-CM | POA: Diagnosis not present

## 2019-05-03 DIAGNOSIS — I248 Other forms of acute ischemic heart disease: Secondary | ICD-10-CM | POA: Diagnosis present

## 2019-05-03 DIAGNOSIS — Z87891 Personal history of nicotine dependence: Secondary | ICD-10-CM | POA: Diagnosis not present

## 2019-05-03 DIAGNOSIS — I5041 Acute combined systolic (congestive) and diastolic (congestive) heart failure: Secondary | ICD-10-CM | POA: Diagnosis present

## 2019-05-03 DIAGNOSIS — Z20828 Contact with and (suspected) exposure to other viral communicable diseases: Secondary | ICD-10-CM | POA: Diagnosis present

## 2019-05-03 DIAGNOSIS — R911 Solitary pulmonary nodule: Secondary | ICD-10-CM | POA: Diagnosis present

## 2019-05-03 DIAGNOSIS — Z7289 Other problems related to lifestyle: Secondary | ICD-10-CM | POA: Diagnosis not present

## 2019-05-03 DIAGNOSIS — E876 Hypokalemia: Secondary | ICD-10-CM | POA: Diagnosis present

## 2019-05-03 DIAGNOSIS — I509 Heart failure, unspecified: Secondary | ICD-10-CM

## 2019-05-03 DIAGNOSIS — J9811 Atelectasis: Secondary | ICD-10-CM | POA: Diagnosis present

## 2019-05-03 DIAGNOSIS — Z720 Tobacco use: Secondary | ICD-10-CM

## 2019-05-03 LAB — COMPREHENSIVE METABOLIC PANEL
ALT: 17 U/L (ref 0–44)
AST: 24 U/L (ref 15–41)
Albumin: 3.6 g/dL (ref 3.5–5.0)
Alkaline Phosphatase: 81 U/L (ref 38–126)
Anion gap: 13 (ref 5–15)
BUN: 9 mg/dL (ref 6–20)
CO2: 27 mmol/L (ref 22–32)
Calcium: 8.6 mg/dL — ABNORMAL LOW (ref 8.9–10.3)
Chloride: 102 mmol/L (ref 98–111)
Creatinine, Ser: 1.05 mg/dL (ref 0.61–1.24)
GFR calc Af Amer: >60 mL/min (ref >60)
GFR calc non Af Amer: >60 mL/min (ref >60)
Glucose, Bld: 106 mg/dL — ABNORMAL HIGH (ref 70–99)
Potassium: 3.5 mmol/L (ref 3.5–5.1)
Sodium: 142 mmol/L (ref 135–145)
Total Bilirubin: 1.9 mg/dL — ABNORMAL HIGH (ref 0.3–1.2)
Total Protein: 7.5 g/dL (ref 6.5–8.1)

## 2019-05-03 LAB — MAGNESIUM: Magnesium: 1.8 mg/dL (ref 1.7–2.4)

## 2019-05-03 LAB — URINALYSIS, ROUTINE W REFLEX MICROSCOPIC
Bilirubin Urine: NEGATIVE
Glucose, UA: NEGATIVE mg/dL
Hgb urine dipstick: NEGATIVE
Ketones, ur: NEGATIVE mg/dL
Leukocytes,Ua: NEGATIVE
Nitrite: NEGATIVE
Protein, ur: NEGATIVE mg/dL
Specific Gravity, Urine: 1.005 (ref 1.005–1.030)
pH: 5 (ref 5.0–8.0)

## 2019-05-03 LAB — ECHOCARDIOGRAM COMPLETE
Height: 71 in
Weight: 5703.74 oz

## 2019-05-03 LAB — CBC
HCT: 34.4 % — ABNORMAL LOW (ref 39.0–52.0)
Hemoglobin: 10.2 g/dL — ABNORMAL LOW (ref 13.0–17.0)
MCH: 25.4 pg — ABNORMAL LOW (ref 26.0–34.0)
MCHC: 29.7 g/dL — ABNORMAL LOW (ref 30.0–36.0)
MCV: 85.6 fL (ref 80.0–100.0)
Platelets: 230 10*3/uL (ref 150–400)
RBC: 4.02 MIL/uL — ABNORMAL LOW (ref 4.22–5.81)
RDW: 17.4 % — ABNORMAL HIGH (ref 11.5–15.5)
WBC: 10 10*3/uL (ref 4.0–10.5)
nRBC: 0 % (ref 0.0–0.2)

## 2019-05-03 LAB — TROPONIN I (HIGH SENSITIVITY): Troponin I (High Sensitivity): 19 ng/L — ABNORMAL HIGH (ref <18)

## 2019-05-03 LAB — PROCALCITONIN: Procalcitonin: <0.1 ng/mL

## 2019-05-03 LAB — TSH: TSH: 2.743 u[IU]/mL (ref 0.350–4.500)

## 2019-05-03 MED ORDER — SACUBITRIL-VALSARTAN 24-26 MG PO TABS
1.0000 | ORAL_TABLET | Freq: Two times a day (BID) | ORAL | Status: DC
  Administered 2019-05-03 – 2019-05-05 (×5): 1 via ORAL
  Filled 2019-05-03 (×6): qty 1

## 2019-05-03 MED ORDER — PNEUMOCOCCAL VAC POLYVALENT 25 MCG/0.5ML IJ INJ
0.5000 mL | INJECTION | INTRAMUSCULAR | Status: DC
  Filled 2019-05-03: qty 0.5

## 2019-05-03 MED ORDER — PERFLUTREN LIPID MICROSPHERE
1.0000 mL | INTRAVENOUS | Status: AC | PRN
  Administered 2019-05-03: 2 mL via INTRAVENOUS
  Filled 2019-05-03: qty 10

## 2019-05-03 MED ORDER — FUROSEMIDE 10 MG/ML IJ SOLN
80.0000 mg | Freq: Every day | INTRAMUSCULAR | Status: DC
  Administered 2019-05-04 – 2019-05-05 (×3): 80 mg via INTRAVENOUS
  Filled 2019-05-03 (×4): qty 8

## 2019-05-03 MED ORDER — LIDOCAINE HCL 1 % IJ SOLN
INTRAMUSCULAR | Status: AC
  Filled 2019-05-03: qty 10

## 2019-05-03 NOTE — Progress Notes (Addendum)
Patient ID: Michael Haynes, male   DOB: September 30, 1986, 33 y.o.   MRN: [id number] Patient presented to ultrasound department today for right thoracentesis.  On limited ultrasound right posterior chest there is only a small pleural effusion noted, not safely accessible at this time due to proximity to lung.  Procedure canceled.  Patient notified.  Patient denies significant chest pain or worsening dyspnea.

## 2019-05-03 NOTE — Progress Notes (Signed)
PROGRESS NOTE    Michael Haynes  FAO:[id number] DOB: 10-31-1985 DOA: 05/02/2019 PCP: Patient, No Pcp Per   Brief Narrative:  Michael Haynes  is Michael Haynes 33 y.o. male, w morbid obeisity, asthma, presents with c/o dyspnea for several months, as well as lower extremity edema.  Slight cough, mostly dry but sometimes clear sputum.  Pt denies any foamy urine.  Denies fever, chills, cp, palp, n/v, diarrhea, brbpr, black stool, dysuria, hematuria.   In ED,   T 98.4, P 121 R 16, Bp 176/119 Pox 96% on RA Wt 158.8kg  CTA chest IMPRESSION: 1. No demonstrable pulmonary embolus. No thoracic aortic aneurysm or dissection.  2. Enlarged main pulmonary outflow tract, Michael Haynes finding indicative of pulmonary arterial hypertension.  3. Cardiomegaly. Reflux of contrast into the inferior vena cava and hepatic veins may well represent Michael Haynes degree of increase in right heart pressure.  4. Right pleural effusion. Right base atelectasis.  5. 2.1 x 2.1 cm nodular appearing opacity abutting the pleura in the right lower lobe. Suspect rounded atelectasis. Atypical pneumonia is Michael Haynes differential consideration. Neoplasm is Michael Haynes third differential consideration. Consider one of the following in 3 months for both low-risk and high-risk individuals: (Michael Haynes) repeat chest CT, (b) follow-up PET-CT, or (c) tissue sampling. This recommendation follows the consensus statement: Guidelines for Management of Incidental Pulmonary Nodules Detected on CT Images: From the Fleischner Society 2017; Radiology 2017; 284:228-243. It may well be reasonable to proceed with PET-CT at this time given this nodular opacity as well as prominent right pretracheal lymph node.  6. Mildly enlarged right pretracheal lymph node. No other adenopathy by size criteria.  Na 143, K 3.3 Bun 7, Creatinne 0.95   Glucose 102  Wbc 8.9, Hgb 10.8, Plt 220 Mcv 84.8, Rdw 17.4,   Trop 19 BNP 439.0  Trop 19-> 19  Pt will be admitted for dyspnea,  cap, pulmonary nodule,  tachycardia, hypertension uncontrolled, and lower ext edema.    Assessment & Plan:   Principal Problem:   Dyspnea Active Problems:   Edema   Pulmonary hypertension (HCC)   Asthma   Obesity, Class III, BMI 40-49.9 (morbid obesity) (HCC)   Hypokalemia   Tobacco abuse   Heart Failure with Reduced Ejection Fraction with Exacerbation  Pulmonary Hypertension  Shortness of breath  Elevated troponin Echo with EF 25-30%, RV with mildly reduced systolic function, PASP 37, IVC dilated with <50% resp variability (see report)  Troponin elevated, but flat, demand 2/2 HF Continue IV lasix, increased to 80 mg daily Cardiology c/s, appreciate recs -> planning for entresto, outpatient L and R heart cath, suspect non ischemic cm HIV pending, normal TSH I/O, daily weights  Edema 2/2 HF above Check LFT, slightly elevated bili, follow Check urinalysis -> pending LE [place] negative for DVT  Hypokalemia Improved, continue to monitor with diuresis  RLL nodular opacity abutting the pleura in the right lower lobe  Consider atelectasis vs pneumonia vs malignancy Repeat chest CT or follow up PET - CT or tissue sampling in 3 months (consider PET -CT given opacity and prominent R pretracheal LN) Will discuss with pulmonology on 8/4 AM Will treat for CAP Blood culture x2 Urine strep antigen Urine legionella antigen Sputum Rocephin 1gm iv qday Zithromax  iv qday  R pleural effusion Not enough for thoracentesis, follow imaging intermittently  Hypertension uncontrolled Start carvedilol 12.5mg  po bid Start entresto Continue diuresis as above  DVT prophylaxis: lovenox Code Status: full  Family Communication: none at bedside, declines me calling Disposition Plan: pending -  requires continued inpatient care due to new diagnosis of acute systolic heart failure requiring further IV diuresis, cardiology c/s and await s/o  Consultants:   cardiology  Procedures:   Echo IMPRESSIONS    1. The left ventricle has severely reduced systolic function, with an ejection fraction of 25-30%. The cavity size was moderately dilated. Left ventricular diastolic Doppler parameters are consistent with pseudonormalization. Left ventricular diffuse  hypokinesis.  2. The right ventricle has mildly reduced systolic function. The cavity was mildly enlarged. There is no increase in right ventricular wall thickness.  3. Left atrial size was mildly dilated.  4. Right atrial size was mildly dilated.  5. Trivial pericardial effusion is present.  6. No evidence of mitral valve stenosis. Trivial mitral regurgitation.  7. The aortic valve is tricuspid. No stenosis of the aortic valve.  8. The aorta is normal in size and structure.  9. The aortic root is normal in size and structure. 10. The inferior vena cava was dilated in size with <50% respiratory variability. PA systolic pressure 37 mmHg.  LE US Summary: Right: There is no evidence of deep vein thrombosis in the lower extremity. Portions of this examination were limited- see technologist comments above. Left: No evidence of deep vein thrombosis in the lower extremity. Portions of this examination were limited- see technologist comments above.  Bilateral lower extremity pulsatile venous flow is suggestive of elevated right heart pressure.  Antimicrobials:  Anti-infectives (From admission, onward)   Start     Dose/Rate Route Frequency Ordered Stop   05/02/19 2230  cefTRIAXone (ROCEPHIN) 1 g in sodium chloride 0.9 % 100 mL IVPB     1 g 200 mL/hr over 30 Minutes Intravenous Daily at bedtime 05/02/19 2218     05/02/19 2230  azithromycin (ZITHROMAX) 500 mg in sodium chloride 0.9 % 250 mL IVPB     500 mg 250 mL/hr over 60 Minutes Intravenous Daily at bedtime 05/02/19 2218       Subjective: SOB, swelling for over Stillman Buenger month  Objective: Vitals:   05/03/19 0037 05/03/19 0255 05/03/19 0438 05/03/19 1506  BP: (!) 145/103  122/71 (!) 138/104 113/77  Pulse: (!) 114 94 (!) 102 93  Resp: (!) 24 (!) 22 18 20   Temp: 98.8 F (37.1 C) 98.7 F (37.1 C) 98.8 F (37.1 C) 98.1 F (36.7 C)  TempSrc: Oral   Oral  SpO2: 100% 100% 100% 100%  Weight:      Height:        Intake/Output Summary (Last 24 hours) at 05/03/2019 1655 Last data filed at 05/03/2019 [id number] Gross per 24 hour  Intake 240 ml  Output 500 ml  Net -260 ml   Filed Weights   05/02/19 1608 05/03/19 0025  Weight: (!) 158.8 kg (!) 161.7 kg    Examination:  General exam: Appears calm and comfortable  Respiratory system: Clear to auscultation. Respiratory effort normal. Cardiovascular system: S1 & S2 heard, RRR.  Gastrointestinal system: Abdomen is nondistended, soft and nontender.  Central nervous system: Alert and oriented. No focal neurological deficits. Extremities: 2+ LE edema bilaterally Skin: No rashes, lesions or ulcers Psychiatry: Judgement and insight appear normal. Mood & affect appropriate.     Data Reviewed: I have personally reviewed following labs and imaging studies  CBC: Recent Labs  Lab 05/02/19 1618 05/03/19 0441  WBC 8.9 10.0  NEUTROABS 6.8  --   HGB 10.8* 10.2*  HCT 35.6* 34.4*  MCV 84.8 85.6  PLT 220 230   Basic Metabolic Panel:  Recent Labs  Lab 05/02/19 1618 05/02/19 2351 05/03/19 0441  NA 143  --  142  K 3.3*  --  3.5  CL 105  --  102  CO2 25  --  27  GLUCOSE 102*  --  106*  BUN 7  --  9  CREATININE 0.95  --  1.05  CALCIUM 8.6*  --  8.6*  MG  --  1.8  --    GFR: Estimated Creatinine Clearance: 154.1 mL/min (by C-G formula based on SCr of 1.05 mg/dL). Liver Function Tests: Recent Labs  Lab 05/03/19 0441  AST 24  ALT 17  ALKPHOS 81  BILITOT 1.9*  PROT 7.5  ALBUMIN 3.6   No results for input(s): LIPASE, AMYLASE in the last 168 hours. No results for input(s): AMMONIA in the last 168 hours. Coagulation Profile: No results for input(s): INR, PROTIME in the last 168 hours. Cardiac Enzymes: No  results for input(s): CKTOTAL, CKMB, CKMBINDEX, TROPONINI in the last 168 hours. BNP (last 3 results) No results for input(s): PROBNP in the last 8760 hours. HbA1C: No results for input(s): HGBA1C in the last 72 hours. CBG: No results for input(s): GLUCAP in the last 168 hours. Lipid Profile: No results for input(s): CHOL, HDL, LDLCALC, TRIG, CHOLHDL, LDLDIRECT in the last 72 hours. Thyroid Function Tests: Recent Labs    05/03/19 0441  TSH 2.743   Anemia Panel: No results for input(s): VITAMINB12, FOLATE, FERRITIN, TIBC, IRON, RETICCTPCT in the last 72 hours. Sepsis Labs: Recent Labs  Lab 05/03/19 0441  PROCALCITON <0.10    Recent Results (from the past 240 hour(s))  SARS Coronavirus 2 [facility] order, Performed in [facility] hospital lab) Nasopharyngeal Nasopharyngeal Swab     Status: None   Collection Time: 05/02/19  6:47 PM   Specimen: Nasopharyngeal Swab  Result Value Ref Range Status   SARS Coronavirus 2 NEGATIVE NEGATIVE Final    Comment: (NOTE) If result is NEGATIVE SARS-CoV-2 target nucleic acids are NOT DETECTED. The SARS-CoV-2 RNA is generally detectable in upper and lower  respiratory specimens during the acute phase of infection. The lowest  concentration of SARS-CoV-2 viral copies this assay can detect is 250  copies / mL. Chanler Mendonca negative result does not preclude SARS-CoV-2 infection  and should not be used as the sole basis for treatment or other  patient management decisions.  Hodan Wurtz negative result may occur with  improper specimen collection / handling, submission of specimen other  than nasopharyngeal swab, presence of viral mutation(s) within the  areas targeted by this assay, and inadequate number of viral copies  (<250 copies / mL). Wanza Szumski negative result must be combined with clinical  observations, patient history, and epidemiological information. If result is POSITIVE SARS-CoV-2 target nucleic acids are DETECTED. The SARS-CoV-2 RNA is generally detectable in  upper and lower  respiratory specimens dur ing the acute phase of infection.  Positive  results are indicative of active infection with SARS-CoV-2.  Clinical  correlation with patient history and other diagnostic information is  necessary to determine patient infection status.  Positive results do  not rule out bacterial infection or co-infection with other viruses. If result is PRESUMPTIVE POSTIVE SARS-CoV-2 nucleic acids MAY BE PRESENT.   Afia Messenger presumptive positive result was obtained on the submitted specimen  and confirmed on repeat testing.  While 2019 novel coronavirus  (SARS-CoV-2) nucleic acids may be present in the submitted sample  additional confirmatory testing may be necessary for epidemiological  and / or clinical management purposes  to differentiate between  SARS-CoV-2 and other Sarbecovirus currently known to infect humans.  If clinically indicated additional testing with an alternate test  methodology [phone]([id number]) is advised. The SARS-CoV-2 RNA is generally  detectable in upper and lower respiratory sp ecimens during the acute  phase of infection. The expected result is Negative. Fact Sheet for Patients:  BoilerBrush.com.cyhttps://www.fda.gov/media/136312/download Fact Sheet for Healthcare Providers: https://pope.com/https://www.fda.gov/media/136313/download This test is not yet approved or cleared by the [place] FDA and has been authorized for detection and/or diagnosis of SARS-CoV-2 by FDA under an Emergency Use Authorization (EUA).  This EUA will remain in effect (meaning this test can be used) for the duration of the COVID-19 declaration under Section 564(b)(1) of the Act, 21 U.S.C. section 360bbb-3(b)(1), unless the authorization is terminated or revoked sooner. Performed at [facility], [address] [place], [place] [address]   Culture, blood (routine x 2)     Status: None (Preliminary result)   Collection Time: 05/02/19 11:51 PM   Specimen: BLOOD  Result Value Ref  Range Status   Specimen Description   Final    BLOOD LEFT ANTECUBITAL Performed at [facility], [address] [place], [place] [address]    Special Requests   Final    BOTTLES DRAWN AEROBIC AND ANAEROBIC Blood Culture adequate volume Performed at [facility], [address] [place], [place] [address]    Culture   Final    NO GROWTH < 12 HOURS Performed at [facility], [address] [place], [place] [address]    Report Status PENDING  Incomplete  Culture, blood (routine x 2)     Status: None (Preliminary result)   Collection Time: 05/02/19 11:51 PM   Specimen: BLOOD  Result Value Ref Range Status   Specimen Description   Final    BLOOD RIGHT ANTECUBITAL Performed at [facility], [address] [place], [place] [address]    Special Requests   Final    BOTTLES DRAWN AEROBIC AND ANAEROBIC Blood Culture adequate volume Performed at [facility], [address] [place], [place] [address]    Culture   Final    NO GROWTH < 12 HOURS Performed at [facility], [address] [place], [place] [address]    Report Status PENDING  Incomplete         Radiology Studies: Dg Chest 2 View  Result Date: 05/02/2019 CLINICAL DATA:  Cough and shortness of breath.  Left arm tingling. EXAM: CHEST - 2 VIEW COMPARISON:  03/27/2013 FINDINGS: Cardiac silhouette is mildly enlarged. No mediastinal or hilar masses. No evidence of adenopathy. Lungs are clear.  No pleural effusion or pneumothorax. Skeletal structures are unremarkable. IMPRESSION: 1. No acute cardiopulmonary disease. 2. Mild cardiomegaly. Electronically Signed   By: Amie Portlandavid  Ormond M.D.   On: 05/02/2019 16:51   Ct Angio Chest Pe W And/or Wo Contrast  Result Date: 05/02/2019 CLINICAL DATA:  Tachycardia and shortness of breath EXAM: CT ANGIOGRAPHY CHEST WITH CONTRAST TECHNIQUE: Multidetector CT imaging of the chest was performed using the standard  protocol during bolus administration of intravenous contrast. Multiplanar CT image reconstructions and MIPs were obtained to evaluate the vascular anatomy. CONTRAST:  100mL OMNIPAQUE IOHEXOL 350 MG/ML SOLN COMPARISON:  Chest radiograph May 02, 2019 FINDINGS: Cardiovascular: There is no demonstrable pulmonary embolus. There is no thoracic aortic aneurysm or dissection. The visualized great vessels appear normal. There is cardiomegaly. There is no pericardial effusion or pericardial thickening.  The main pulmonary outflow tract measures 3.6 cm, abnormally prominent. Mediastinum/Nodes: Visualized thyroid appears unremarkable. There are multiple subcentimeter mediastinal lymph nodes. There is Analina Filla right pretracheal lymph node measuring 1.2 x 1.2 cm. No esophageal lesions are evident. Lungs/Pleura: There is Yanira Tolsma moderate free-flowing right pleural effusion. There is atelectatic change in the right base. There is Shylee Durrett nodular appearing opacity abutting the pleura in the right lower lobe measuring 2.1 x 2.1 cm, best appreciated on axial slice 79 series 6, coronal slice 125 series 9, and sagittal slice 63 series 10. right lung is clear. Upper Abdomen: There is reflux of contrast into the inferior vena cava and hepatic veins. Visualized upper abdominal structures otherwise appear unremarkable. Musculoskeletal: No blastic or lytic bone lesions. No evident chest wall lesions. Review of the MIP images confirms the above findings. IMPRESSION: 1. No demonstrable pulmonary embolus. No thoracic aortic aneurysm or dissection. 2. Enlarged main pulmonary outflow tract, Addylin Manke finding indicative of pulmonary arterial hypertension. 3. Cardiomegaly. Reflux of contrast into the inferior vena cava and hepatic veins may well represent Kivon Aprea degree of increase in right heart pressure. 4.  Right pleural effusion.  Right base atelectasis. 5. 2.1 x 2.1 cm nodular appearing opacity abutting the pleura in the right lower lobe. Suspect rounded atelectasis.  Atypical pneumonia is Teniyah Seivert differential consideration. Neoplasm is Nicky Milhouse third differential consideration. Consider one of the following in 3 months for both low-risk and high-risk individuals: (Vijay Durflinger) repeat chest CT, (b) follow-up PET-CT, or (c) tissue sampling. This recommendation follows the consensus statement: Guidelines for Management of Incidental Pulmonary Nodules Detected on CT Images: From the Fleischner Society 2017; Radiology 2017; 284:228-243. It may well be reasonable to proceed with PET-CT at this time given this nodular opacity as well as prominent right pretracheal lymph node. 6. Mildly enlarged right pretracheal lymph node. No other adenopathy by size criteria. Electronically Signed   By: Bretta Bang III M.D.   On: 05/02/2019 20:01   [place] Chest (pleural Effusion)  Result Date: 05/03/2019 CLINICAL DATA:  Right pleural effusion. EXAM: CHEST ULTRASOUND COMPARISON:  CTA of the chest on 05/02/2019 FINDINGS: By ultrasound there is only Latoya Maulding very small amount of pleural fluid in the posterior right pleural space. There was not enough fluid present to allow for safe thoracentesis. IMPRESSION: Small right pleural effusion by ultrasound. There was not enough fluid present to allow for safe thoracentesis. Electronically Signed   By: Irish Lack M.D.   On: 05/03/2019 11:46   Vas [place] Lower Extremity Venous (dvt)  Result Date: 05/03/2019  Lower Venous Study Indications: Swelling.  Limitations: Body habitus and poor ultrasound/tissue interface. Comparison Study: No prior study. Performing Technologist: Gertie Fey MHA, RDMS, RVT, RDCS  Examination Guidelines: Noami Bove complete evaluation includes B-mode imaging, spectral Doppler, color Doppler, and power Doppler as needed of all accessible portions of each vessel. Bilateral testing is considered an integral part of Kyran Connaughton complete examination. Limited examinations for reoccurring indications may be performed as noted.   +---------+---------------+---------+-----------+----------+--------------+ RIGHT    CompressibilityPhasicitySpontaneityPropertiesSummary        +---------+---------------+---------+-----------+----------+--------------+ CFV      Full           No       Yes                  pulsatile flow +---------+---------------+---------+-----------+----------+--------------+ SFJ      Full                                                        +---------+---------------+---------+-----------+----------+--------------+  FV Prox  Full                                                        +---------+---------------+---------+-----------+----------+--------------+ FV Mid   Full                                                        +---------+---------------+---------+-----------+----------+--------------+ FV Distal                                             Not visualized +---------+---------------+---------+-----------+----------+--------------+ PFV                                                   Not visualized +---------+---------------+---------+-----------+----------+--------------+ POP      Full           No       Yes                  pulsatile flow +---------+---------------+---------+-----------+----------+--------------+ PTV      Full                                                        +---------+---------------+---------+-----------+----------+--------------+ PERO     Full                                                        +---------+---------------+---------+-----------+----------+--------------+   +---------+---------------+---------+-----------+----------+--------------+ LEFT     CompressibilityPhasicitySpontaneityPropertiesSummary        +---------+---------------+---------+-----------+----------+--------------+ CFV      Full           No       Yes                  pulsatile flow  +---------+---------------+---------+-----------+----------+--------------+ SFJ      Full                                                        +---------+---------------+---------+-----------+----------+--------------+ FV Prox  Full                                                        +---------+---------------+---------+-----------+----------+--------------+ FV Mid  Not visualized +---------+---------------+---------+-----------+----------+--------------+ FV Distal                                             Not visualized +---------+---------------+---------+-----------+----------+--------------+ PFV                                                   Not visualized +---------+---------------+---------+-----------+----------+--------------+ POP      Full           No       Yes                  pulsatile flow +---------+---------------+---------+-----------+----------+--------------+ PTV                                                   Not visualized +---------+---------------+---------+-----------+----------+--------------+ PERO                                                  Not visualized +---------+---------------+---------+-----------+----------+--------------+   Summary: Right: There is no evidence of deep vein thrombosis in the lower extremity. Portions of this examination were limited- see technologist comments above. Left: No evidence of deep vein thrombosis in the lower extremity. Portions of this examination were limited- see technologist comments above. Bilateral lower extremity pulsatile venous flow is suggestive of elevated right heart pressure.  *See table(s) above for measurements and observations. Electronically signed by Ruta Hinds MD on 05/03/2019 at 4:02:07 PM.    Final         Scheduled Meds: . carvedilol  12.5 mg Oral BID WC  . enoxaparin (LOVENOX) injection  60 mg Subcutaneous  QHS  . [START ON 05/04/2019] furosemide  80 mg Intravenous Daily  . [START ON 05/04/2019] pneumococcal 23 valent vaccine  0.5 mL Intramuscular Tomorrow-1000  . potassium chloride  20 mEq Oral Daily  . sacubitril-valsartan  1 tablet Oral BID  . sodium chloride flush  3 mL Intravenous Q12H   Continuous Infusions: . sodium chloride    . azithromycin 500 mg (05/03/19 0122)  . cefTRIAXone (ROCEPHIN)  IV 1 g (05/03/19 0308)     LOS: 0 days    Time spent: over 13 min    Fayrene Helper, MD [facility] Pager [email or web address]  If 7PM-7AM, please contact night-coverage [email or web address] Password [email or web address] 05/03/2019, 4:55 PM

## 2019-05-03 NOTE — Consult Note (Addendum)
Cardiology Consultation:   Patient ID: Michael Haynes MRN: [id number]; DOB: 1986/04/13  Admit date: 05/02/2019 Date of Consult: 05/03/2019  Primary Care Provider: Patient, No Pcp Per Primary Cardiologist: Rollene Rotunda, MD --NEW Primary Electrophysiologist:  None    Patient Profile:   Michael Haynes is a 33 y.o. male with a hx of morbid obesity, asthma, prior smoker who is being seen today for the evaluation of dysnpea and lower extremity edema at the request of Dr. Lowell Guitar.  History of Present Illness:   Michael Haynes has past medical history as above. He presented to the ED with complaints of dyspnea and lower leg edema. No PE on Chest CT, possible right heart pressure. He was noted to have uncontrolled HTN,  He was noted to have a loud S2 on exam, possible pulmonary hypertension. He was also found to have RLL density -possible CAP. He is being treated with IV antibiotics and further workup. He has been started on carvedilol, losartan and IV lasix. Echocardiogram has been ordered.   Michael Haynes reports that his only history is asthma.  He was not raised by his biologic parents, however he knows of no cardiac history in either of his parents.  He has 2 brothers who are both healthy.  He tells me that he works for [employer] and has been working from home during the pandemic.  He has been a smoker of half pack per day since~2006.  He quit smoking about a month ago due to his symptoms.  He drinks 2-3 liquor drinks about every 2 to 3 days.  He denies any drug use.   The patient tells me that beginning in January of this year he developed cough with phlegm and wheezing which he attributed to asthma.  He has been treated several times since then with steroids and albuterol.  He was also treated for pneumonia in February.  In late June he had return of symptoms but he was also feeling an increase in fluid in his chest and had new leg swelling.  He was also having dyspnea on exertion with  walking up 2 flights of stairs to his apartment.  He also had orthopnea and has been sleeping in a chair for about the last month and a half.  He denies any chest pain or pressure.  He does have occasional fast, hard heartbeats.  He had a tele-visit and was told he could have possible cardiac asthma.  He was referred to urgent care for chest x-ray which reportedly showed fluid.  He was given Lasix and referred to cardiology in [facility].  He prefers to be seen in [place].  He says that his symptoms did improve with Lasix.    Chest CT was negative for PE.  It did show cardiomegaly with reflux of contrast into the inferior vena cava and hepatic veins, may well represent a degree of increase in right heart pressure.  He also had a right pleural effusion and right base atelectasis. Echocardiogram has just been completed. Lower extremity Dopplers with preliminary results, no DVT bilaterally.  Bilateral lower extremity pulsatile venous flow suggestive of elevated right heart pressure. Chest x-ray showed no acute cardiopulmonary disease.  No pleural effusion.  Mild cardiomegaly.  Heart Pathway Score:     Past Medical History:  Diagnosis Date   Asthma     History reviewed. No pertinent surgical history.   Home Medications:  Prior to Admission medications   Medication Sig Start Date End Date Taking? Authorizing Provider  albuterol (  PROVENTIL HFA;VENTOLIN HFA) 108 (90 Base) MCG/ACT inhaler Inhale 2 puffs into the lungs every 6 (six) hours as needed for wheezing. 06/19/17  Yes Elvina SidleLauenstein, Kurt, MD  albuterol (PROVENTIL) (2.5 MG/3ML) 0.083% nebulizer solution Take 3 mLs by nebulization every 6 (six) hours as needed for shortness of breath or wheezing. 03/09/19  Yes [provider]    Inpatient Medications: Scheduled Meds:  lidocaine       carvedilol  12.5 mg Oral BID WC   enoxaparin (LOVENOX) injection  60 mg Subcutaneous QHS   furosemide  40 mg Intravenous Daily   losartan  25 mg  Oral Daily   [START ON 05/04/2019] pneumococcal 23 valent vaccine  0.5 mL Intramuscular Tomorrow-1000   potassium chloride  20 mEq Oral Daily   sodium chloride flush  3 mL Intravenous Q12H   Continuous Infusions:  sodium chloride     azithromycin 500 mg (05/03/19 0122)   cefTRIAXone (ROCEPHIN)  IV 1 g (05/03/19 0308)   PRN Meds: sodium chloride, acetaminophen **OR** acetaminophen, perflutren lipid microspheres (DEFINITY) IV suspension, sodium chloride flush  Allergies:   No Known Allergies  Social History:   Social History   Socioeconomic History   Marital status: Single    Spouse name: Not on file   Number of children: Not on file   Years of education: Not on file   Highest education level: Not on file  Occupational History   Not on file  Social Needs   Financial resource strain: Not on file   Food insecurity    Worry: Not on file    Inability: Not on file   Transportation needs    Medical: Not on file    Non-medical: Not on file  Tobacco Use   Smoking status: Former Smoker    Packs/day: 0.00    Types: Cigarettes    Quit date: 04/04/2019    Years since quitting: 0.0   Smokeless tobacco: Never Used  Substance and Sexual Activity   Alcohol use: Yes    Alcohol/week: 6.0 standard drinks    Types: 6 Cans of beer per week    Frequency: Never   Drug use: No   Sexual activity: Yes    Birth control/protection: Condom  Lifestyle   Physical activity    Days per week: Not on file    Minutes per session: Not on file   Stress: Not on file  Relationships   Social connections    Talks on phone: Not on file    Gets together: Not on file    Attends religious service: Not on file    Active member of club or organization: Not on file    Attends meetings of clubs or organizations: Not on file    Relationship status: Not on file   Intimate partner violence    Fear of current or ex partner: Not on file    Emotionally abused: Not on file    Physically  abused: Not on file    Forced sexual activity: Not on file  Other Topics Concern   Not on file  Social History Narrative   Not on file    Family History:    Family History  Problem Relation Age of Onset   Hypertension Father    Diabetes Father    Healthy Brother    Healthy Brother      ROS:  Please see the history of present illness.   All other ROS reviewed and negative.     Physical Exam/Data:  Vitals:   05/03/19 0025 05/03/19 0037 05/03/19 0255 05/03/19 0438  BP:  (!) 145/103 122/71 (!) 138/104  Pulse:  (!) 114 94 (!) 102  Resp:  (!) 24 (!) 22 18  Temp:  98.8 F (37.1 C) 98.7 F (37.1 C) 98.8 F (37.1 C)  TempSrc:  Oral    SpO2:  100% 100% 100%  Weight: (!) 161.7 kg     Height:  (1.803 m)       Intake/Output Summary (Last 24 hours) at 05/03/2019 1022 Last data filed at 05/03/2019 0833 Gross per 24 hour  Intake 240 ml  Output 500 ml  Net -260 ml   Last 3 Weights 05/03/2019 05/02/2019  Weight (lbs) 356 lb 7.7 oz 350 lb  Weight (kg) 161.7 kg 158.759 kg     Body mass index is 49.72 kg/m.  General:  Obese male in no acute distress HEENT: normal Lymph: no adenopathy Neck: no JVD Endocrine:  No thryomegaly Vascular: No carotid bruits; FA pulses 2+ bilaterally without bruits  Cardiac:  normal S1, S2; RRR; no murmur  Lungs:  clear to auscultation bilaterally, no wheezing, rhonchi or rales  Abd: soft, nontender, no hepatomegaly  Ext: 2+ bilateral lower extremity edema at least up to knees Musculoskeletal:  No deformities, BUE and BLE strength normal and equal Skin: warm and dry  Neuro:  CNs 2-12 intact, no focal abnormalities noted Psych:  Normal affect   EKG:  The EKG was personally reviewed and demonstrates:  Sinus tachycardia, 123 bpm, no evidence of LVH  Telemetry:  Telemetry was personally reviewed and demonstrates:  SR/ST 90's-100's  Relevant CV Studies:  Echo pending   Laboratory Data:  High Sensitivity Troponin:   Recent Labs  Lab  05/02/19 1618 05/02/19 1810 05/03/19 0441  TROPONINIHS 19* 19* 19*     Cardiac EnzymesNo results for input(s): TROPONINI in the last 168 hours. No results for input(s): TROPIPOC in the last 168 hours.  Chemistry Recent Labs  Lab 05/02/19 1618 05/03/19 0441  NA 143 142  K 3.3* 3.5  CL 105 102  CO2 25 27  GLUCOSE 102* 106*  BUN 7 9  CREATININE 0.95 1.05  CALCIUM 8.6* 8.6*  GFRNONAA >60 >60  GFRAA >60 >60  ANIONGAP 13 13    Recent Labs  Lab 05/03/19 0441  PROT 7.5  ALBUMIN 3.6  AST 24  ALT 17  ALKPHOS 81  BILITOT 1.9*   Hematology Recent Labs  Lab 05/02/19 1618 05/03/19 0441  WBC 8.9 10.0  RBC 4.20* 4.02*  HGB 10.8* 10.2*  HCT 35.6* 34.4*  MCV 84.8 85.6  MCH 25.7* 25.4*  MCHC 30.3 29.7*  RDW 17.4* 17.4*  PLT 220 230   BNP Recent Labs  Lab 05/02/19 1618  BNP 439.0*    DDimer No results for input(s): DDIMER in the last 168 hours.   Radiology/Studies:  Dg Chest 2 View  Result Date: 05/02/2019 CLINICAL DATA:  Cough and shortness of breath.  Left arm tingling. EXAM: CHEST - 2 VIEW COMPARISON:  03/27/2013 FINDINGS: Cardiac silhouette is mildly enlarged. No mediastinal or hilar masses. No evidence of adenopathy. Lungs are clear.  No pleural effusion or pneumothorax. Skeletal structures are unremarkable. IMPRESSION: 1. No acute cardiopulmonary disease. 2. Mild cardiomegaly. Electronically Signed   By: Amie Portland M.D.   On: 05/02/2019 16:51   Ct Angio Chest Pe W And/or Wo Contrast  Result Date: 05/02/2019 CLINICAL DATA:  Tachycardia and shortness of breath EXAM: CT ANGIOGRAPHY CHEST WITH CONTRAST TECHNIQUE:  Multidetector CT imaging of the chest was performed using the standard protocol during bolus administration of intravenous contrast. Multiplanar CT image reconstructions and MIPs were obtained to evaluate the vascular anatomy. CONTRAST:  100mL OMNIPAQUE IOHEXOL 350 MG/ML SOLN COMPARISON:  Chest radiograph May 02, 2019 FINDINGS: Cardiovascular: There is no  demonstrable pulmonary embolus. There is no thoracic aortic aneurysm or dissection. The visualized great vessels appear normal. There is cardiomegaly. There is no pericardial effusion or pericardial thickening. The main pulmonary outflow tract measures 3.6 cm, abnormally prominent. Mediastinum/Nodes: Visualized thyroid appears unremarkable. There are multiple subcentimeter mediastinal lymph nodes. There is a right pretracheal lymph node measuring 1.2 x 1.2 cm. No esophageal lesions are evident. Lungs/Pleura: There is a moderate free-flowing right pleural effusion. There is atelectatic change in the right base. There is a nodular appearing opacity abutting the pleura in the right lower lobe measuring 2.1 x 2.1 cm, best appreciated on axial slice 79 series 6, coronal slice 125 series 9, and sagittal slice 63 series 10. right lung is clear. Upper Abdomen: There is reflux of contrast into the inferior vena cava and hepatic veins. Visualized upper abdominal structures otherwise appear unremarkable. Musculoskeletal: No blastic or lytic bone lesions. No evident chest wall lesions. Review of the MIP images confirms the above findings. IMPRESSION: 1. No demonstrable pulmonary embolus. No thoracic aortic aneurysm or dissection. 2. Enlarged main pulmonary outflow tract, a finding indicative of pulmonary arterial hypertension. 3. Cardiomegaly. Reflux of contrast into the inferior vena cava and hepatic veins may well represent a degree of increase in right heart pressure. 4.  Right pleural effusion.  Right base atelectasis. 5. 2.1 x 2.1 cm nodular appearing opacity abutting the pleura in the right lower lobe. Suspect rounded atelectasis. Atypical pneumonia is a differential consideration. Neoplasm is a third differential consideration. Consider one of the following in 3 months for both low-risk and high-risk individuals: (a) repeat chest CT, (b) follow-up PET-CT, or (c) tissue sampling. This recommendation follows the consensus  statement: Guidelines for Management of Incidental Pulmonary Nodules Detected on CT Images: From the Fleischner Society 2017; Radiology 2017; 284:228-243. It may well be reasonable to proceed with PET-CT at this time given this nodular opacity as well as prominent right pretracheal lymph node. 6. Mildly enlarged right pretracheal lymph node. No other adenopathy by size criteria. Electronically Signed   By: Bretta BangWilliam  Woodruff III M.D.   On: 05/02/2019 20:01   Vas [place] Lower Extremity Venous (dvt)  Result Date: 05/03/2019  Lower Venous Study Indications: Swelling.  Limitations: Body habitus and poor ultrasound/tissue interface. Performing Technologist: Gertie FeyMichelle Simonetti MHA, RDMS, RVT, RDCS  Examination Guidelines: A complete evaluation includes B-mode imaging, spectral Doppler, color Doppler, and power Doppler as needed of all accessible portions of each vessel. Bilateral testing is considered an integral part of a complete examination. Limited examinations for reoccurring indications may be performed as noted.  +---------+---------------+---------+-----------+----------+--------------+  RIGHT     Compressibility Phasicity Spontaneity Properties Summary         +---------+---------------+---------+-----------+----------+--------------+  CFV       Full            No        Yes                    pulsatile flow  +---------+---------------+---------+-----------+----------+--------------+  SFJ       Full                                                             +---------+---------------+---------+-----------+----------+--------------+  FV Prox   Full                                                             +---------+---------------+---------+-----------+----------+--------------+  FV Mid    Full                                                             +---------+---------------+---------+-----------+----------+--------------+  FV Distal                                                  Not visualized   +---------+---------------+---------+-----------+----------+--------------+  PFV                                                        Not visualized  +---------+---------------+---------+-----------+----------+--------------+  POP       Full            No        Yes                    pulsatile flow  +---------+---------------+---------+-----------+----------+--------------+  PTV       Full                                                             +---------+---------------+---------+-----------+----------+--------------+  PERO      Full                                                             +---------+---------------+---------+-----------+----------+--------------+   +---------+---------------+---------+-----------+----------+--------------+  LEFT      Compressibility Phasicity Spontaneity Properties Summary         +---------+---------------+---------+-----------+----------+--------------+  CFV       Full            No        Yes                    pulsatile flow  +---------+---------------+---------+-----------+----------+--------------+  SFJ       Full                                                             +---------+---------------+---------+-----------+----------+--------------+  FV Prox   Full                                                             +---------+---------------+---------+-----------+----------+--------------+  FV Mid                                                     Not visualized  +---------+---------------+---------+-----------+----------+--------------+  FV Distal                                                  Not visualized  +---------+---------------+---------+-----------+----------+--------------+  PFV                                                        Not visualized  +---------+---------------+---------+-----------+----------+--------------+  POP       Full            No        Yes                    pulsatile flow   +---------+---------------+---------+-----------+----------+--------------+  PTV                                                        Not visualized  +---------+---------------+---------+-----------+----------+--------------+  PERO                                                       Not visualized  +---------+---------------+---------+-----------+----------+--------------+     Summary: Right: There is no evidence of deep vein thrombosis in the lower extremity. Portions of this examination were limited- see technologist comments above. Left: No evidence of deep vein thrombosis in the lower extremity. Portions of this examination were limited- see technologist comments above. Bilateral lower extremity pulsatile venous flow is suggestive of elevated right heart pressure.  *See table(s) above for measurements and observations.    Preliminary     Assessment and Plan:   Dyspnea and lower extremity edema/pulmonary edema -Pt with problems with SOB, cough and wheeze for most of 2020, attributed to asthma. One month hx of worsened DOE, orthopnea and new LE edema.  -BNP 439. Troponins low in flat trend.  EKG without evidence of LVH, but cardiomegaly on CXR and chest CT. Also suggestion of elevated right heart pressures on CT and lower extremity dopplers.  -BP control in process. Started on lasix 40 mg IV daily. Not much output documented. Needs strict I&O and daily. Wt. Looks like wt is up 6 pounds, ?difference in scales.  -Symptoms have improved with lasix. Continue current hypertension therapy and diuresis. Could increase lasix to BID for more aggressive diuresis.  -Follow up on echo.  -Pt also being treated for possible CAP.   Hypertension -BP significantly elevated on presentation, 176/119. No previously treated at home. He says his BP was only mildly elevated at urgent care in June.  -Carvedilol 12.5 mg, losartan 25 mg and lasix  added. BP improving 138/104 this am.  -Renal function normal.  -Low  sodium diet. Pt states that he has a very low sodium diet.  Elevated right heart pressure/possible pulmonary hypertension -Suggested by chest CT and lower extremity Dopplers. -?  If related to recent frequent asthma  Tachycardia -Heart rates in the 120's on presentation. Improving with carvedilol and treatment for CAP. Currently 90's-100's.  -TSH in normal range.   Tobacco abuse -Prior smoker ~1/2 PPD since 2006. Quit about 1 month ago due to current symptoms. Pt strongly advise to continue cessation. He thinks he can.   Obesity -Body mass index is 49.72 kg/m. -Pt states that he has a healthy diet. He has not been active at all and sits all day for work. Advised to get out and walk as long as he stays away from other people due to COVID precautions.     For questions or updates, please contact [place] Please consult [email or web address] for contact info under     Signed, Daune Perch, NP  05/03/2019 10:22 AM   History and all data above reviewed.  Patient examined.  I agree with the findings as above.  The patient reports about six months of increased edema and cough.  Treated for asthma.  Productive currently of white sputum.  No chest pain,  No palpitations.  No fevers or chills.  Found to have CHF on CXR.  Now with with echo which I just reviewed.  EF is about 30% with global hypokinesis.    The patient exam reveals COR:RRR  ,  Lungs: Clear  ,  Abd: Positive bowel sounds, no rebound no guarding, Ext Severe edema  .  All available labs, radiology testing, previous records reviewed. Agree with documented assessment and plan. Acute systolic HF:  I suspect that this is non ischemic.  For now IV diuresis,  Need to have primary team work up anemia.  I will check an HIV test.  Thyroids OK.  He does drink ETOH but on excessively .  He will need to stop this.  I will plan an out patient right and left heart cath.  I will change Losartan to Entresto.   Increase Lasix.    Jeneen Rinks Yigit Norkus  11:39 AM   05/03/2019

## 2019-05-03 NOTE — ED Notes (Signed)
ED TO INPATIENT HANDOFF REPORT  Name/Age/Gender Michael Haynes 33 y.o. male  Code Status    Code Status Orders  (From admission, onward)         Start     Ordered   05/02/19 2137  Full code  Continuous     05/02/19 2139        Code Status History    This patient has a current code status but no historical code status.   Advance Care Planning Activity      Home/SNF/Other Home  Chief Complaint Elevated Heart Rate / Tingling and Numbness in Left Arm   Level of Care/Admitting Diagnosis ED Disposition    ED Disposition Condition Comment   Admit  Hospital Area: [facility] [[id number]]  Level of Care: Telemetry [5]  Admit to tele based on following criteria: Monitor for Ischemic changes  Covid Evaluation: Confirmed COVID Negative  Diagnosis: Dyspnea [549826]  Admitting Physician: Pearson Grippe [[id number]]  Attending Physician: Pearson Grippe [[id number]]  PT Class (Do Not Modify): Observation [104]  PT Acc Code (Do Not Modify): Observation [10022]       Medical History Past Medical History:  Diagnosis Date  . Asthma     Allergies No Known Allergies  IV Location/Drains/Wounds Patient Lines/Drains/Airways Status   Active Line/Drains/Airways    Name:   Placement date:   Placement time:   Site:   Days:   Peripheral IV 05/02/19 Right Antecubital   05/02/19    1850    Antecubital   1          Labs/Imaging Results for orders placed or performed during the hospital encounter of 05/02/19 (from the past 48 hour(s))  Basic metabolic panel     Status: Abnormal   Collection Time: 05/02/19  4:18 PM  Result Value Ref Range   Sodium 143 135 - 145 mmol/L   Potassium 3.3 (L) 3.5 - 5.1 mmol/L   Chloride 105 98 - 111 mmol/L   CO2 25 22 - 32 mmol/L   Glucose, Bld 102 (H) 70 - 99 mg/dL   BUN 7 6 - 20 mg/dL   Creatinine, Ser 4.15 0.61 - 1.24 mg/dL   Calcium 8.6 (L) 8.9 - 10.3 mg/dL   GFR calc non Af Amer >60 >60 mL/min   GFR calc Af Amer >60 >60 mL/min   Anion  gap 13 5 - 15    Comment: Performed at [facility], [address], [place], [place] [address]  CBC with Differential     Status: Abnormal   Collection Time: 05/02/19  4:18 PM  Result Value Ref Range   WBC 8.9 4.0 - 10.5 K/uL   RBC 4.20 (L) 4.22 - 5.81 MIL/uL   Hemoglobin 10.8 (L) 13.0 - 17.0 g/dL   HCT 07.6 (L) 80.8 - 81.1 %   MCV 84.8 80.0 - 100.0 fL   MCH 25.7 (L) 26.0 - 34.0 pg   MCHC 30.3 30.0 - 36.0 g/dL   RDW 03.1 (H) 59.4 - 58.5 %   Platelets 220 150 - 400 K/uL   nRBC 0.0 0.0 - 0.2 %   Neutrophils Relative % 76 %   Neutro Abs 6.8 1.7 - 7.7 K/uL   Lymphocytes Relative 14 %   Lymphs Abs 1.2 0.7 - 4.0 K/uL   Monocytes Relative 8 %   Monocytes Absolute 0.7 0.1 - 1.0 K/uL   Eosinophils Relative 1 %   Eosinophils Absolute 0.1 0.0 - 0.5 K/uL   Basophils Relative 1 %  Basophils Absolute 0.1 0.0 - 0.1 K/uL   Immature Granulocytes 0 %   Abs Immature Granulocytes 0.04 0.00 - 0.07 K/uL    Comment: Performed at [facility], [address] [place], [place] [address]  Troponin I (High Sensitivity)     Status: Abnormal   Collection Time: 05/02/19  4:18 PM  Result Value Ref Range   Troponin I (High Sensitivity) 19 (H) <18 ng/L    Comment: (NOTE) Elevated high sensitivity troponin I (hsTnI) values and significant  changes across serial measurements may suggest ACS but many other  chronic and acute conditions are known to elevate hsTnI results.  Refer to the "Links" section for chest pain algorithms and additional  guidance. Performed at [facility], [address] [place], [place] [address]   Brain natriuretic peptide     Status: Abnormal   Collection Time: 05/02/19  4:18 PM  Result Value Ref Range   B Natriuretic Peptide 439.0 (H) 0.0 - 100.0 pg/mL    Comment: Performed at [facility], [address] [place], [place] [address]  Troponin I (High Sensitivity)     Status: Abnormal   Collection Time:  05/02/19  6:10 PM  Result Value Ref Range   Troponin I (High Sensitivity) 19 (H) <18 ng/L    Comment: (NOTE) Elevated high sensitivity troponin I (hsTnI) values and significant  changes across serial measurements may suggest ACS but many other  chronic and acute conditions are known to elevate hsTnI results.  Refer to the "Links" section for chest pain algorithms and additional  guidance. Performed at [facility], [address] [place], [place] [address]   SARS Coronavirus 2 [facility](Hospital order, Performed in [facility] hospital lab) Nasopharyngeal Nasopharyngeal Swab     Status: None   Collection Time: 05/02/19  6:47 PM   Specimen: Nasopharyngeal Swab  Result Value Ref Range   SARS Coronavirus 2 NEGATIVE NEGATIVE    Comment: (NOTE) If result is NEGATIVE SARS-CoV-2 target nucleic acids are NOT DETECTED. The SARS-CoV-2 RNA is generally detectable in upper and lower  respiratory specimens during the acute phase of infection. The lowest  concentration of SARS-CoV-2 viral copies this assay can detect is 250  copies / mL. A negative result does not preclude SARS-CoV-2 infection  and should not be used as the sole basis for treatment or other  patient management decisions.  A negative result may occur with  improper specimen collection / handling, submission of specimen other  than nasopharyngeal swab, presence of viral mutation(s) within the  areas targeted by this assay, and inadequate number of viral copies  (<250 copies / mL). A negative result must be combined with clinical  observations, patient history, and epidemiological information. If result is POSITIVE SARS-CoV-2 target nucleic acids are DETECTED. The SARS-CoV-2 RNA is generally detectable in upper and lower  respiratory specimens dur ing the acute phase of infection.  Positive  results are indicative of active infection with SARS-CoV-2.  Clinical  correlation with patient history and other diagnostic  information is  necessary to determine patient infection status.  Positive results do  not rule out bacterial infection or co-infection with other viruses. If result is PRESUMPTIVE POSTIVE SARS-CoV-2 nucleic acids MAY BE PRESENT.   A presumptive positive result was obtained on the submitted specimen  and confirmed on repeat testing.  While 2019 novel coronavirus  (SARS-CoV-2) nucleic acids may be present in the submitted sample  additional confirmatory testing may be necessary  for epidemiological  and / or clinical management purposes  to differentiate between  SARS-CoV-2 and other Sarbecovirus currently known to infect humans.  If clinically indicated additional testing with an alternate test  methodology [phone]) is advised. The SARS-CoV-2 RNA is generally  detectable in upper and lower respiratory sp ecimens during the acute  phase of infection. The expected result is Negative. Fact Sheet for Patients:  StrictlyIdeas.no Fact Sheet for Healthcare Providers: BankingDealers.co.za This test is not yet approved or cleared by the [place] FDA and has been authorized for detection and/or diagnosis of SARS-CoV-2 by FDA under an Emergency Use Authorization (EUA).  This EUA will remain in effect (meaning this test can be used) for the duration of the COVID-19 declaration under Section 564(b)(1) of the Act, 21 U.S.C. section 360bbb-3(b)(1), unless the authorization is terminated or revoked sooner. Performed at [facility], [address] [place], [place] [address]    Dg Chest 2 View  Result Date: 05/02/2019 CLINICAL DATA:  Cough and shortness of breath.  Left arm tingling. EXAM: CHEST - 2 VIEW COMPARISON:  03/27/2013 FINDINGS: Cardiac silhouette is mildly enlarged. No mediastinal or hilar masses. No evidence of adenopathy. Lungs are clear.  No pleural effusion or pneumothorax. Skeletal structures are unremarkable.  IMPRESSION: 1. No acute cardiopulmonary disease. 2. Mild cardiomegaly. Electronically Signed   By: Lajean Manes M.D.   On: 05/02/2019 16:51   Ct Angio Chest Pe W And/or Wo Contrast  Result Date: 05/02/2019 CLINICAL DATA:  Tachycardia and shortness of breath EXAM: CT ANGIOGRAPHY CHEST WITH CONTRAST TECHNIQUE: Multidetector CT imaging of the chest was performed using the standard protocol during bolus administration of intravenous contrast. Multiplanar CT image reconstructions and MIPs were obtained to evaluate the vascular anatomy. CONTRAST:  146mL OMNIPAQUE IOHEXOL 350 MG/ML SOLN COMPARISON:  Chest radiograph May 02, 2019 FINDINGS: Cardiovascular: There is no demonstrable pulmonary embolus. There is no thoracic aortic aneurysm or dissection. The visualized great vessels appear normal. There is cardiomegaly. There is no pericardial effusion or pericardial thickening. The main pulmonary outflow tract measures 3.6 cm, abnormally prominent. Mediastinum/Nodes: Visualized thyroid appears unremarkable. There are multiple subcentimeter mediastinal lymph nodes. There is a right pretracheal lymph node measuring 1.2 x 1.2 cm. No esophageal lesions are evident. Lungs/Pleura: There is a moderate free-flowing right pleural effusion. There is atelectatic change in the right base. There is a nodular appearing opacity abutting the pleura in the right lower lobe measuring 2.1 x 2.1 cm, best appreciated on axial slice 79 series 6, coronal slice 626 series 9, and sagittal slice 63 series 10. right lung is clear. Upper Abdomen: There is reflux of contrast into the inferior vena cava and hepatic veins. Visualized upper abdominal structures otherwise appear unremarkable. Musculoskeletal: No blastic or lytic bone lesions. No evident chest wall lesions. Review of the MIP images confirms the above findings. IMPRESSION: 1. No demonstrable pulmonary embolus. No thoracic aortic aneurysm or dissection. 2. Enlarged main pulmonary outflow  tract, a finding indicative of pulmonary arterial hypertension. 3. Cardiomegaly. Reflux of contrast into the inferior vena cava and hepatic veins may well represent a degree of increase in right heart pressure. 4.  Right pleural effusion.  Right base atelectasis. 5. 2.1 x 2.1 cm nodular appearing opacity abutting the pleura in the right lower lobe. Suspect rounded atelectasis. Atypical pneumonia is a differential consideration. Neoplasm is a third differential consideration. Consider one of the following in 3 months for both low-risk and high-risk individuals: (a) repeat chest CT, (b) follow-up  PET-CT, or (c) tissue sampling. This recommendation follows the consensus statement: Guidelines for Management of Incidental Pulmonary Nodules Detected on CT Images: From the Fleischner Society 2017; Radiology 2017; 284:228-243. It may well be reasonable to proceed with PET-CT at this time given this nodular opacity as well as prominent right pretracheal lymph node. 6. Mildly enlarged right pretracheal lymph node. No other adenopathy by size criteria. Electronically Signed   By: Bretta BangWilliam  Woodruff III M.D.   On: 05/02/2019 20:01    Pending Labs Unresulted Labs (From admission, onward)    Start     Ordered   05/09/19 0500  Creatinine, serum  (enoxaparin (LOVENOX)    CrCl >/= 30 ml/min)  Weekly,   R    Comments: while on enoxaparin therapy    05/02/19 2139   05/03/19 0500  Comprehensive metabolic panel  Tomorrow morning,   R     05/02/19 2139   05/03/19 0500  CBC  Tomorrow morning,   R     05/02/19 2139   05/03/19 0500  TSH  Tomorrow morning,   R     05/02/19 2139   05/02/19 2218  Culture, blood (routine x 2)  BLOOD CULTURE X 2,   R (with STAT occurrences)     05/02/19 2218   05/02/19 2218  Strep pneumoniae urinary antigen  Once,   STAT     05/02/19 2218   05/02/19 2218  Legionella Pneumophila Serogp 1 Ur Ag  Once,   STAT     05/02/19 2218   05/02/19 2137  HIV antibody (Routine Testing)  Once,   STAT      05/02/19 2139   05/02/19 2105  Magnesium  ONCE - STAT,   STAT     05/02/19 2105          Vitals/Pain Today's Vitals   05/02/19 1930 05/02/19 2130 05/02/19 2312 05/02/19 2330  BP: (!) 179/135 (!) 174/140 (!) 182/125 (!) 182/117  Pulse: (!) 119 (!) 107 (!) 109 (!) 107  Resp: 17  18 20   Temp:      TempSrc:      SpO2: 96% 100% 100% 99%  Weight:      Height:      PainSc:        Isolation Precautions No active isolations  Medications Medications  enoxaparin (LOVENOX) injection 60 mg (60 mg Subcutaneous Given 05/02/19 2349)  sodium chloride flush (NS) 0.9 % injection 3 mL (3 mLs Intravenous Given 05/02/19 2357)  sodium chloride flush (NS) 0.9 % injection 3 mL (has no administration in time range)  0.9 %  sodium chloride infusion (has no administration in time range)  acetaminophen (TYLENOL) tablet 650 mg (has no administration in time range)    Or  acetaminophen (TYLENOL) suppository 650 mg (has no administration in time range)  furosemide (LASIX) injection 40 mg (40 mg Intravenous Given 05/02/19 2348)  potassium chloride SA (K-DUR) CR tablet 20 mEq (20 mEq Oral Given 05/02/19 2342)  carvedilol (COREG) tablet 12.5 mg (12.5 mg Oral Given 05/02/19 2343)  losartan (COZAAR) tablet 25 mg (25 mg Oral Given 05/02/19 2344)  cefTRIAXone (ROCEPHIN) 1 g in sodium chloride 0.9 % 100 mL IVPB (has no administration in time range)  azithromycin (ZITHROMAX) 500 mg in sodium chloride 0.9 % 250 mL IVPB (has no administration in time range)  iohexol (OMNIPAQUE) 350 MG/ML injection 100 mL (100 mLs Intravenous Contrast Given 05/02/19 1940)  sodium chloride (PF) 0.9 % injection (  Given by Other 05/02/19 1950)  labetalol (NORMODYNE) injection 10 mg (10 mg Intravenous Given 05/02/19 2113)  furosemide (LASIX) injection 40 mg (40 mg Intravenous Given 05/02/19 2113)  potassium chloride SA (K-DUR) CR tablet 40 mEq (40 mEq Oral Given 05/02/19 2113)    Mobility walks

## 2019-05-03 NOTE — Progress Notes (Signed)
Bilateral lower extremity venous duplex completed. Refer to "CV Proc" under chart review to view preliminary results.  05/03/2019 9:45 AM Maudry Mayhew, MHA, RVT, RDCS, RDMS

## 2019-05-03 NOTE — Progress Notes (Signed)
  Echocardiogram 2D Echocardiogram has been performed.  Burnett Kanaris 05/03/2019, 9:42 AM

## 2019-05-04 ENCOUNTER — Inpatient Hospital Stay (HOSPITAL_COMMUNITY): Payer: 59

## 2019-05-04 DIAGNOSIS — I5041 Acute combined systolic (congestive) and diastolic (congestive) heart failure: Secondary | ICD-10-CM

## 2019-05-04 LAB — HIV ANTIBODY (ROUTINE TESTING W REFLEX)
HIV Screen 4th Generation wRfx: NONREACTIVE
HIV Screen 4th Generation wRfx: NONREACTIVE

## 2019-05-04 LAB — COMPREHENSIVE METABOLIC PANEL
ALT: 17 U/L (ref 0–44)
AST: 21 U/L (ref 15–41)
Albumin: 3.4 g/dL — ABNORMAL LOW (ref 3.5–5.0)
Alkaline Phosphatase: 73 U/L (ref 38–126)
Anion gap: 13 (ref 5–15)
BUN: 11 mg/dL (ref 6–20)
CO2: 26 mmol/L (ref 22–32)
Calcium: 8.6 mg/dL — ABNORMAL LOW (ref 8.9–10.3)
Chloride: 102 mmol/L (ref 98–111)
Creatinine, Ser: 0.99 mg/dL (ref 0.61–1.24)
GFR calc Af Amer: >60 mL/min (ref >60)
GFR calc non Af Amer: >60 mL/min (ref >60)
Glucose, Bld: 92 mg/dL (ref 70–99)
Potassium: 3.1 mmol/L — ABNORMAL LOW (ref 3.5–5.1)
Sodium: 141 mmol/L (ref 135–145)
Total Bilirubin: 1.5 mg/dL — ABNORMAL HIGH (ref 0.3–1.2)
Total Protein: 7.3 g/dL (ref 6.5–8.1)

## 2019-05-04 LAB — MAGNESIUM: Magnesium: 1.6 mg/dL — ABNORMAL LOW (ref 1.7–2.4)

## 2019-05-04 LAB — CBC
HCT: 36.2 % — ABNORMAL LOW (ref 39.0–52.0)
Hemoglobin: 10.8 g/dL — ABNORMAL LOW (ref 13.0–17.0)
MCH: 25.4 pg — ABNORMAL LOW (ref 26.0–34.0)
MCHC: 29.8 g/dL — ABNORMAL LOW (ref 30.0–36.0)
MCV: 85 fL (ref 80.0–100.0)
Platelets: 256 10*3/uL (ref 150–400)
RBC: 4.26 MIL/uL (ref 4.22–5.81)
RDW: 17.6 % — ABNORMAL HIGH (ref 11.5–15.5)
WBC: 8.4 10*3/uL (ref 4.0–10.5)
nRBC: 0 % (ref 0.0–0.2)

## 2019-05-04 LAB — PROCALCITONIN: Procalcitonin: <0.1 ng/mL

## 2019-05-04 MED ORDER — POTASSIUM CHLORIDE CRYS ER 20 MEQ PO TBCR
40.0000 meq | EXTENDED_RELEASE_TABLET | ORAL | Status: AC
  Administered 2019-05-04 (×2): 40 meq via ORAL
  Filled 2019-05-04 (×2): qty 2

## 2019-05-04 MED ORDER — MAGNESIUM SULFATE 2 GM/50ML IV SOLN
2.0000 g | Freq: Once | INTRAVENOUS | Status: AC
  Administered 2019-05-04: 2 g via INTRAVENOUS
  Filled 2019-05-04: qty 50

## 2019-05-04 NOTE — Progress Notes (Signed)
PROGRESS NOTE    Michael Haynes  ZOX:[id number] DOB: 09/28/1986 DOA: 05/02/2019 PCP: Patient, No Pcp Per   Brief Narrative:  Michael Haynes  is Michael Haynes 33 y.o. male, w morbid obeisity, asthma, presents with c/o dyspnea for several months, as well as lower extremity edema.  Slight cough, mostly dry but sometimes clear sputum.  Pt denies any foamy urine.  Denies fever, chills, cp, palp, n/v, diarrhea, brbpr, black stool, dysuria, hematuria.   In ED,   T 98.4, P 121 R 16, Bp 176/119 Pox 96% on RA Wt 158.8kg  CTA chest IMPRESSION: 1. No demonstrable pulmonary embolus. No thoracic aortic aneurysm or dissection.  2. Enlarged main pulmonary outflow tract, Yaremi Stahlman finding indicative of pulmonary arterial hypertension.  3. Cardiomegaly. Reflux of contrast into the inferior vena cava and hepatic veins may well represent Josanne Boerema degree of increase in right heart pressure.  4. Right pleural effusion. Right base atelectasis.  5. 2.1 x 2.1 cm nodular appearing opacity abutting the pleura in the right lower lobe. Suspect rounded atelectasis. Atypical pneumonia is Daizy Outen differential consideration. Neoplasm is Piotr Christopher third differential consideration. Consider one of the following in 3 months for both low-risk and high-risk individuals: (Aveline Daus) repeat chest CT, (b) follow-up PET-CT, or (c) tissue sampling. This recommendation follows the consensus statement: Guidelines for Management of Incidental Pulmonary Nodules Detected on CT Images: From the Fleischner Society 2017; Radiology 2017; 284:228-243. It may well be reasonable to proceed with PET-CT at this time given this nodular opacity as well as prominent right pretracheal lymph node.  6. Mildly enlarged right pretracheal lymph node. No other adenopathy by size criteria.  Na 143, K 3.3 Bun 7, Creatinne 0.95   Glucose 102  Wbc 8.9, Hgb 10.8, Plt 220 Mcv 84.8, Rdw 17.4,   Trop 19 BNP 439.0  Trop 19-> 19  Pt will be admitted for dyspnea,  cap, pulmonary nodule,  tachycardia, hypertension uncontrolled, and lower ext edema.   He was admitted for shortness of breath, CAP, and new onset systolic HF.  Assessment & Plan:   Principal Problem:   Dyspnea Active Problems:   Edema   Pulmonary hypertension (HCC)   Asthma   Obesity, Class III, BMI 40-49.9 (morbid obesity) (HCC)   Hypokalemia   Tobacco abuse   Heart failure (HCC)   Acute combined systolic and diastolic heart failure (HCC)   Heart Failure with Reduced Ejection Fraction with Exacerbation   Pulmonary Hypertension   Shortness of breath   Elevated troponin Echo with EF 25-30%, RV with mildly reduced systolic function, PASP 37, IVC dilated with <50% resp variability (see report)  Troponin elevated, but flat, demand 2/2 HF Continue IV lasix, increased to 80 mg daily Cardiology c/s, appreciate recs -> planning for entresto, outpatient L and R heart cath, suspect non ischemic cm HIV negative, normal TSH I/O, daily weights Likely d/c home in AM per cards  Edema 2/2 HF above Check LFT, slightly elevated bili, follow Check urinalysis -> pending LE US negative for DVT  Hypokalemia   Hypomag Replace and follow  RLL nodular opacity abutting the pleura in the right lower lobe  Consider atelectasis vs pneumonia vs malignancy Repeat chest CT or follow up PET - CT or tissue sampling in 3 months (consider PET -CT given opacity and prominent R pretracheal LN) Will discuss with pulmonology on 8/4 AM -> needs to follow up outpatient for repeat imaging after treatment of pneumonia.  Pulm f/u information placed in d/c info.  Please discuss with pt prior to  d/c. Will treat for CAP Blood culture x2 Urine strep antigen Urine legionella antigen Sputum Rocephin 1gm iv qday Zithromax 500mg  iv qday  R pleural effusion Not enough for thoracentesis, follow imaging intermittently  Hypertension uncontrolled Start carvedilol 12.5mg  po bid Start entresto Continue diuresis as  above  DVT prophylaxis: lovenox Code Status: full  Family Communication: none at bedside, declines me calling Disposition Plan: pending - requires continued inpatient care due to new diagnosis of acute systolic heart failure requiring further IV diuresis, cardiology c/s and await s/o  Consultants:   cardiology  Procedures:  Echo IMPRESSIONS    1. The left ventricle has severely reduced systolic function, with an ejection fraction of 25-30%. The cavity size was moderately dilated. Left ventricular diastolic Doppler parameters are consistent with pseudonormalization. Left ventricular diffuse  hypokinesis.  2. The right ventricle has mildly reduced systolic function. The cavity was mildly enlarged. There is no increase in right ventricular wall thickness.  3. Left atrial size was mildly dilated.  4. Right atrial size was mildly dilated.  5. Trivial pericardial effusion is present.  6. No evidence of mitral valve stenosis. Trivial mitral regurgitation.  7. The aortic valve is tricuspid. No stenosis of the aortic valve.  8. The aorta is normal in size and structure.  9. The aortic root is normal in size and structure. 10. The inferior vena cava was dilated in size with <50% respiratory variability. PA systolic pressure 37 mmHg.  LE [place] Summary: Right: There is no evidence of deep vein thrombosis in the lower extremity. Portions of this examination were limited- see technologist comments above. Left: No evidence of deep vein thrombosis in the lower extremity. Portions of this examination were limited- see technologist comments above.  Bilateral lower extremity pulsatile venous flow is suggestive of elevated right heart pressure.  Antimicrobials:  Anti-infectives (From admission, onward)   Start     Dose/Rate Route Frequency Ordered Stop   05/02/19 2230  cefTRIAXone (ROCEPHIN) 1 g in sodium chloride 0.9 % 100 mL IVPB     1 g 200 mL/hr over 30 Minutes Intravenous Daily at bedtime  05/02/19 2218     05/02/19 2230  azithromycin (ZITHROMAX) 500 mg in sodium chloride 0.9 % 250 mL IVPB     500 mg 250 mL/hr over 60 Minutes Intravenous Daily at bedtime 05/02/19 2218       Subjective: Asking about d/c Frustrated about blood draws  Objective: Vitals:   05/03/19 1506 05/03/19 2144 05/04/19 0510 05/04/19 1457  BP: 113/77 122/79 (!) 133/91 126/76  Pulse: 93 88 100 91  Resp: 20 20 20 18   Temp: 98.1 F (36.7 C) 98.2 F (36.8 C) 98.3 F (36.8 C) 98 F (36.7 C)  TempSrc: Oral Oral Oral Oral  SpO2: 100% 100% 99% 100%  Weight:   (!) 159 kg   Height:        Intake/Output Summary (Last 24 hours) at 05/04/2019 1533 Last data filed at 05/04/2019 0930 Gross per 24 hour  Intake 843.86 ml  Output 4625 ml  Net -3781.14 ml   Filed Weights   05/02/19 1608 05/03/19 0025 05/04/19 0510  Weight: (!) 158.8 kg (!) 161.7 kg (!) 159 kg    Examination:  General: No acute distress. Cardiovascular: Heart sounds show Duglas Heier regular rate, and rhythm Lungs: Clear to auscultation bilaterally  Abdomen: Soft, nontender, nondistended Neurological: Alert and oriented 3. Moves all extremities 4 with equal strength. Cranial nerves II through XII grossly intact. Skin: Warm and dry. No rashes  or lesions. Extremities: bilateral LE edema     Data Reviewed: I have personally reviewed following labs and imaging studies  CBC: Recent Labs  Lab 05/02/19 1618 05/03/19 0441 05/04/19 0403  WBC 8.9 10.0 8.4  NEUTROABS 6.8  --   --   HGB 10.8* 10.2* 10.8*  HCT 35.6* 34.4* 36.2*  MCV 84.8 85.6 85.0  PLT 220 230 256   Basic Metabolic Panel: Recent Labs  Lab 05/02/19 1618 05/02/19 2351 05/03/19 0441 05/04/19 0403  NA 143  --  142 141  K 3.3*  --  3.5 3.1*  CL 105  --  102 102  CO2 25  --  27 26  GLUCOSE 102*  --  106* 92  BUN 7  --  9 11  CREATININE 0.95  --  1.05 0.99  CALCIUM 8.6*  --  8.6* 8.6*  MG  --  1.8  --  1.6*   GFR: Estimated Creatinine Clearance: 161.8 mL/min (by C-G  formula based on SCr of 0.99 mg/dL). Liver Function Tests: Recent Labs  Lab 05/03/19 0441 05/04/19 0403  AST 24 21  ALT 17 17  ALKPHOS 81 73  BILITOT 1.9* 1.5*  PROT 7.5 7.3  ALBUMIN 3.6 3.4*   No results for input(s): LIPASE, AMYLASE in the last 168 hours. No results for input(s): AMMONIA in the last 168 hours. Coagulation Profile: No results for input(s): INR, PROTIME in the last 168 hours. Cardiac Enzymes: No results for input(s): CKTOTAL, CKMB, CKMBINDEX, TROPONINI in the last 168 hours. BNP (last 3 results) No results for input(s): PROBNP in the last 8760 hours. HbA1C: No results for input(s): HGBA1C in the last 72 hours. CBG: No results for input(s): GLUCAP in the last 168 hours. Lipid Profile: No results for input(s): CHOL, HDL, LDLCALC, TRIG, CHOLHDL, LDLDIRECT in the last 72 hours. Thyroid Function Tests: Recent Labs    05/03/19 0441  TSH 2.743   Anemia Panel: No results for input(s): VITAMINB12, FOLATE, FERRITIN, TIBC, IRON, RETICCTPCT in the last 72 hours. Sepsis Labs: Recent Labs  Lab 05/03/19 0441 05/04/19 0403  PROCALCITON <0.10 <0.10    Recent Results (from the past 240 hour(s))  SARS Coronavirus 2 [facility](Hospital order, Performed in [facility] hospital lab) Nasopharyngeal Nasopharyngeal Swab     Status: None   Collection Time: 05/02/19  6:47 PM   Specimen: Nasopharyngeal Swab  Result Value Ref Range Status   SARS Coronavirus 2 NEGATIVE NEGATIVE Final    Comment: (NOTE) If result is NEGATIVE SARS-CoV-2 target nucleic acids are NOT DETECTED. The SARS-CoV-2 RNA is generally detectable in upper and lower  respiratory specimens during the acute phase of infection. The lowest  concentration of SARS-CoV-2 viral copies this assay can detect is 250  copies / mL. Abdulmalik Darco negative result does not preclude SARS-CoV-2 infection  and should not be used as the sole basis for treatment or other  patient management decisions.  Layth Cerezo negative result may occur with  improper  specimen collection / handling, submission of specimen other  than nasopharyngeal swab, presence of viral mutation(s) within the  areas targeted by this assay, and inadequate number of viral copies  (<250 copies / mL). Shadonna Benedick negative result must be combined with clinical  observations, patient history, and epidemiological information. If result is POSITIVE SARS-CoV-2 target nucleic acids are DETECTED. The SARS-CoV-2 RNA is generally detectable in upper and lower  respiratory specimens dur ing the acute phase of infection.  Positive  results are indicative of active infection with SARS-CoV-2.  Clinical  correlation with patient history and other diagnostic information is  necessary to determine patient infection status.  Positive results do  not rule out bacterial infection or co-infection with other viruses. If result is PRESUMPTIVE POSTIVE SARS-CoV-2 nucleic acids MAY BE PRESENT.   Blenda Wisecup presumptive positive result was obtained on the submitted specimen  and confirmed on repeat testing.  While 2019 novel coronavirus  (SARS-CoV-2) nucleic acids may be present in the submitted sample  additional confirmatory testing may be necessary for epidemiological  and / or clinical management purposes  to differentiate between  SARS-CoV-2 and other Sarbecovirus currently known to infect humans.  If clinically indicated additional testing with an alternate test  methodology [phone]) is advised. The SARS-CoV-2 RNA is generally  detectable in upper and lower respiratory sp ecimens during the acute  phase of infection. The expected result is Negative. Fact Sheet for Patients:  BoilerBrush.com.cy Fact Sheet for Healthcare Providers: https://pope.com/ This test is not yet approved or cleared by the [place] FDA and has been authorized for detection and/or diagnosis of SARS-CoV-2 by FDA under an Emergency Use Authorization (EUA).  This EUA will remain in  effect (meaning this test can be used) for the duration of the COVID-19 declaration under Section 564(b)(1) of the Act, 21 U.S.C. section 360bbb-3(b)(1), unless the authorization is terminated or revoked sooner. Performed at [facility], [address] [place], [place] [address]   Culture, blood (routine x 2)     Status: None (Preliminary result)   Collection Time: 05/02/19 11:51 PM   Specimen: BLOOD  Result Value Ref Range Status   Specimen Description   Final    BLOOD LEFT ANTECUBITAL Performed at [facility], [address], [place], [place] [address]    Special Requests   Final    BOTTLES DRAWN AEROBIC AND ANAEROBIC Blood Culture adequate volume Performed at [facility], [address] [place], [place] [address]    Culture   Final    NO GROWTH 1 DAY Performed at [facility], [address], [place], [place] [address]    Report Status PENDING  Incomplete  Culture, blood (routine x 2)     Status: None (Preliminary result)   Collection Time: 05/02/19 11:51 PM   Specimen: BLOOD  Result Value Ref Range Status   Specimen Description   Final    BLOOD RIGHT ANTECUBITAL Performed at [facility], [address] [place], [place] [address]    Special Requests   Final    BOTTLES DRAWN AEROBIC AND ANAEROBIC Blood Culture adequate volume Performed at [facility], [address] [place], [place] [address]    Culture   Final    NO GROWTH 1 DAY Performed at [facility], [address], [place], [place] [address]    Report Status PENDING  Incomplete         Radiology Studies: Dg Chest 2 View  Result Date: 05/02/2019 CLINICAL DATA:  Cough and shortness of breath.  Left arm tingling. EXAM: CHEST - 2 VIEW COMPARISON:  03/27/2013 FINDINGS: Cardiac silhouette is mildly enlarged. No mediastinal or hilar masses. No evidence of adenopathy. Lungs are clear.  No pleural  effusion or pneumothorax. Skeletal structures are unremarkable. IMPRESSION: 1. No acute cardiopulmonary disease. 2. Mild cardiomegaly. Electronically Signed   By: Amie Portland M.D.   On: 05/02/2019 16:51   Ct Angio Chest Pe W And/or Wo Contrast  Result Date: 05/02/2019 CLINICAL  DATA:  Tachycardia and shortness of breath EXAM: CT ANGIOGRAPHY CHEST WITH CONTRAST TECHNIQUE: Multidetector CT imaging of the chest was performed using the standard protocol during bolus administration of intravenous contrast. Multiplanar CT image reconstructions and MIPs were obtained to evaluate the vascular anatomy. CONTRAST:  OMNIPAQUE IOHEXOL 350 MG/ML SOLN COMPARISON:  Chest radiograph May 02, 2019 FINDINGS: Cardiovascular: There is no demonstrable pulmonary embolus. There is no thoracic aortic aneurysm or dissection. The visualized great vessels appear normal. There is cardiomegaly. There is no pericardial effusion or pericardial thickening. The main pulmonary outflow tract measures 3.6 cm, abnormally prominent. Mediastinum/Nodes: Visualized thyroid appears unremarkable. There are multiple subcentimeter mediastinal lymph nodes. There is Sheera Illingworth right pretracheal lymph node measuring 1.2 x 1.2 cm. No esophageal lesions are evident. Lungs/Pleura: There is Odetta Forness moderate free-flowing right pleural effusion. There is atelectatic change in the right base. There is Walton Digilio nodular appearing opacity abutting the pleura in the right lower lobe measuring 2.1 x 2.1 cm, best appreciated on axial slice 79 series 6, coronal slice 125 series 9, and sagittal slice 63 series 10. right lung is clear. Upper Abdomen: There is reflux of contrast into the inferior vena cava and hepatic veins. Visualized upper abdominal structures otherwise appear unremarkable. Musculoskeletal: No blastic or lytic bone lesions. No evident chest wall lesions. Review of the MIP images confirms the above findings. IMPRESSION: 1. No demonstrable pulmonary embolus. No thoracic  aortic aneurysm or dissection. 2. Enlarged main pulmonary outflow tract, Leona Pressly finding indicative of pulmonary arterial hypertension. 3. Cardiomegaly. Reflux of contrast into the inferior vena cava and hepatic veins may well represent Makiya Jeune degree of increase in right heart pressure. 4.  Right pleural effusion.  Right base atelectasis. 5. 2.1 x 2.1 cm nodular appearing opacity abutting the pleura in the right lower lobe. Suspect rounded atelectasis. Atypical pneumonia is Paulette Rockford differential consideration. Neoplasm is Javontay Vandam third differential consideration. Consider one of the following in 3 months for both low-risk and high-risk individuals: (Nakeitha Milligan) repeat chest CT, (b) follow-up PET-CT, or (c) tissue sampling. This recommendation follows the consensus statement: Guidelines for Management of Incidental Pulmonary Nodules Detected on CT Images: From the Fleischner Society 2017; Radiology 2017; 284:228-243. It may well be reasonable to proceed with PET-CT at this time given this nodular opacity as well as prominent right pretracheal lymph node. 6. Mildly enlarged right pretracheal lymph node. No other adenopathy by size criteria. Electronically Signed   By: Bretta Bang III M.D.   On: 05/02/2019 20:01   [place] Chest (pleural Effusion)  Result Date: 05/03/2019 CLINICAL DATA:  Right pleural effusion. EXAM: CHEST ULTRASOUND COMPARISON:  CTA of the chest on 05/02/2019 FINDINGS: By ultrasound there is only Eydan Chianese very small amount of pleural fluid in the posterior right pleural space. There was not enough fluid present to allow for safe thoracentesis. IMPRESSION: Small right pleural effusion by ultrasound. There was not enough fluid present to allow for safe thoracentesis. Electronically Signed   By: Irish Lack M.D.   On: 05/03/2019 11:46   Dg Chest Port 1 View  Result Date: 05/04/2019 CLINICAL DATA:  Shortness of breath. EXAM: PORTABLE CHEST 1 VIEW COMPARISON:  CT 05/02/2019.  Chest x-ray 05/02/2019. FINDINGS: Cardiomegaly. Partial  clearing of interstitial prominence suggesting clearing interstitial edema. No pleural effusion or pneumothorax. Identified peripheral density over the right lung base best identified by prior CT. Reference is made to recent CT report. IMPRESSION: 1. Severe cardiomegaly again noted. Partial clearing of mild bilateral interstitial prominence consistent with clearing CHF. 2.  Previously identified rounded pleural-based density in the right best identified by prior CT. Rest of this is made to prior CT report. Electronically Signed   By: Maisie Fushomas  Register   On: 05/04/2019 06:35   Vas [place] Lower Extremity Venous (dvt)  Result Date: 05/03/2019  Lower Venous Study Indications: Swelling.  Limitations: Body habitus and poor ultrasound/tissue interface. Comparison Study: No prior study. Performing Technologist: Gertie FeyMichelle Simonetti MHA, RDMS, RVT, RDCS  Examination Guidelines: Marquasia Schmieder complete evaluation includes B-mode imaging, spectral Doppler, color Doppler, and power Doppler as needed of all accessible portions of each vessel. Bilateral testing is considered an integral part of Jia Dottavio complete examination. Limited examinations for reoccurring indications may be performed as noted.  +---------+---------------+---------+-----------+----------+--------------+  RIGHT     Compressibility Phasicity Spontaneity Properties Summary         +---------+---------------+---------+-----------+----------+--------------+  CFV       Full            No        Yes                    pulsatile flow  +---------+---------------+---------+-----------+----------+--------------+  SFJ       Full                                                             +---------+---------------+---------+-----------+----------+--------------+  FV Prox   Full                                                             +---------+---------------+---------+-----------+----------+--------------+  FV Mid    Full                                                              +---------+---------------+---------+-----------+----------+--------------+  FV Distal                                                  Not visualized  +---------+---------------+---------+-----------+----------+--------------+  PFV                                                        Not visualized  +---------+---------------+---------+-----------+----------+--------------+  POP       Full            No        Yes                    pulsatile flow  +---------+---------------+---------+-----------+----------+--------------+  PTV       Full                                                             +---------+---------------+---------+-----------+----------+--------------+  PERO      Full                                                             +---------+---------------+---------+-----------+----------+--------------+   +---------+---------------+---------+-----------+----------+--------------+  LEFT      Compressibility Phasicity Spontaneity Properties Summary         +---------+---------------+---------+-----------+----------+--------------+  CFV       Full            No        Yes                    pulsatile flow  +---------+---------------+---------+-----------+----------+--------------+  SFJ       Full                                                             +---------+---------------+---------+-----------+----------+--------------+  FV Prox   Full                                                             +---------+---------------+---------+-----------+----------+--------------+  FV Mid                                                     Not visualized  +---------+---------------+---------+-----------+----------+--------------+  FV Distal                                                  Not visualized  +---------+---------------+---------+-----------+----------+--------------+  PFV                                                        Not visualized   +---------+---------------+---------+-----------+----------+--------------+  POP       Full            No        Yes                    pulsatile flow  +---------+---------------+---------+-----------+----------+--------------+  PTV                                                        Not visualized  +---------+---------------+---------+-----------+----------+--------------+  PERO  Not visualized  +---------+---------------+---------+-----------+----------+--------------+   Summary: Right: There is no evidence of deep vein thrombosis in the lower extremity. Portions of this examination were limited- see technologist comments above. Left: No evidence of deep vein thrombosis in the lower extremity. Portions of this examination were limited- see technologist comments above. Bilateral lower extremity pulsatile venous flow is suggestive of elevated right heart pressure.  *See table(s) above for measurements and observations. Electronically signed by Fabienne Bruns MD on 05/03/2019 at 4:02:07 PM.    Final         Scheduled Meds:  carvedilol  12.5 mg Oral BID WC   enoxaparin (LOVENOX) injection  60 mg Subcutaneous QHS   furosemide  80 mg Intravenous Daily   pneumococcal 23 valent vaccine  0.5 mL Intramuscular Tomorrow-1000   potassium chloride  20 mEq Oral Daily   sacubitril-valsartan  1 tablet Oral BID   sodium chloride flush  3 mL Intravenous Q12H   Continuous Infusions:  sodium chloride     azithromycin Stopped (05/04/19 0328)   cefTRIAXone (ROCEPHIN)  IV Stopped (05/03/19 2222)     LOS: 1 day    Time spent: over 30 min    Lacretia Nicks, MD [facility] Pager AMION  If 7PM-7AM, please contact night-coverage [email or web address] Password [facility] 05/04/2019, 3:33 PM

## 2019-05-04 NOTE — Progress Notes (Addendum)
Progress Note  Patient Name: Michael Haynes Date of Encounter: 05/04/2019  Primary Cardiologist: Rollene Rotunda, MD -NEW  Subjective   Patient is up moving around in the room.  He denies any dyspnea.  He is not sure about orthopnea as he did not attempt to lay down last night, head of bed elevated.  He did have a coughing spell 1 time last night.  The patient currently has 2+ lower leg edema.  He says that his edema had gone down overnight but as soon as he got up moving around, it returned. Today he is very concerned about discharging home as he works for [employer] and is concerned about losing his job or possibly needing short-term disability which would lower his pay.  I have discussed with him the process for his full evaluation and titration of medications.  He is also having trouble with need for repeated IV sticks and says he is considering leaving even if not discharged.  I will have Dr. Antoine Poche continue to discuss his care and treatment when he comes later today.  Inpatient Medications    Scheduled Meds:  carvedilol  12.5 mg Oral BID WC   enoxaparin (LOVENOX) injection  60 mg Subcutaneous QHS   furosemide  80 mg Intravenous Michael   pneumococcal 23 valent vaccine  0.5 mL Intramuscular Tomorrow-1000   potassium chloride  20 mEq Oral Michael   potassium chloride  40 mEq Oral Q4H   sacubitril-valsartan  1 tablet Oral BID   sodium chloride flush  3 mL Intravenous Q12H   Continuous Infusions:  sodium chloride     azithromycin Stopped (05/04/19 0328)   cefTRIAXone (ROCEPHIN)  IV Stopped (05/03/19 2222)   magnesium sulfate bolus IVPB     PRN Meds: sodium chloride, acetaminophen **OR** acetaminophen, sodium chloride flush   Vital Signs    Vitals:   05/03/19 0438 05/03/19 1506 05/03/19 2144 05/04/19 0510  BP: (!) 138/104 113/77 122/79 (!) 133/91  Pulse: (!) 102 93 88 100  Resp: Temp: 98.8 F (37.1 C) 98.1 F (36.7 C) 98.2 F (36.8 C) 98.3  F (36.8 C)  TempSrc:  Oral Oral Oral  SpO2: 100% 100% 100% 99%  Weight:    (!) 159 kg  Height:        Intake/Output Summary (Last 24 hours) at 05/04/2019 1118 Last data filed at 05/04/2019 0930 Gross per 24 hour  Intake 843.86 ml  Output 4625 ml  Net -3781.14 ml   Last 3 Weights 05/04/2019 05/03/2019 05/02/2019  Weight (lbs) 350 lb 9.6 oz 356 lb 7.7 oz 350 lb  Weight (kg) 159.031 kg 161.7 kg 158.759 kg      Telemetry    Sinus rhythm in the 90s- Personally Reviewed  ECG    No new tracings- Personally Reviewed  Physical Exam   GEN:  Obese male, no acute distress.   Neck: No JVD Cardiac: RRR, no murmurs, rubs, or gallops.  Respiratory: Clear to auscultation bilaterally. GI: Soft, large, rounded abdomen MS:  2+ lower leg edema; No deformity. Neuro:  Nonfocal  Psych: Normal affect   Labs    High Sensitivity Troponin:   Recent Labs  Lab 05/02/19 1618 05/02/19 1810 05/03/19 0441  TROPONINIHS 19* 19* 19*      Cardiac EnzymesNo results for input(s): TROPONINI in the last 168 hours. No results for input(s): TROPIPOC in the last 168 hours.   Chemistry Recent Labs  Lab 05/02/19 1618 05/03/19 0441 05/04/19 0403  NA  143 142 141  K 3.3* 3.5 3.1*  CL 105 102 102  CO2 25 27 26   GLUCOSE 102* 106* 92  BUN 7 9 11   CREATININE 0.95 1.05 0.99  CALCIUM 8.6* 8.6* 8.6*  PROT  --  7.5 7.3  ALBUMIN  --  3.6 3.4*  AST  --  24 21  ALT  --  17 17  ALKPHOS  --  81 73  BILITOT  --  1.9* 1.5*  GFRNONAA >60 >60 >60  GFRAA >60 >60 >60  ANIONGAP 13 13 13      Hematology Recent Labs  Lab 05/02/19 1618 05/03/19 0441 05/04/19 0403  WBC 8.9 10.0 8.4  RBC 4.20* 4.02* 4.26  HGB 10.8* 10.2* 10.8*  HCT 35.6* 34.4* 36.2*  MCV 84.8 85.6 85.0  MCH 25.7* 25.4* 25.4*  MCHC 30.3 29.7* 29.8*  RDW 17.4* 17.4* 17.6*  PLT 220 230 256    BNP Recent Labs  Lab 05/02/19 1618  BNP 439.0*     DDimer No results for input(s): DDIMER in the last 168 hours.   Radiology    Dg Chest 2  View  Result Date: 05/02/2019 CLINICAL DATA:  Cough and shortness of breath.  Left arm tingling. EXAM: CHEST - 2 VIEW COMPARISON:  03/27/2013 FINDINGS: Cardiac silhouette is mildly enlarged. No mediastinal or hilar masses. No evidence of adenopathy. Lungs are clear.  No pleural effusion or pneumothorax. Skeletal structures are unremarkable. IMPRESSION: 1. No acute cardiopulmonary disease. 2. Mild cardiomegaly. Electronically Signed   By: Amie Portland M.D.   On: 05/02/2019 16:51   Ct Angio Chest Pe W And/or Wo Contrast  Result Date: 05/02/2019 CLINICAL DATA:  Tachycardia and shortness of breath EXAM: CT ANGIOGRAPHY CHEST WITH CONTRAST TECHNIQUE: Multidetector CT imaging of the chest was performed using the standard protocol during bolus administration of intravenous contrast. Multiplanar CT image reconstructions and MIPs were obtained to evaluate the vascular anatomy. CONTRAST:  OMNIPAQUE IOHEXOL 350 MG/ML SOLN COMPARISON:  Chest radiograph May 02, 2019 FINDINGS: Cardiovascular: There is no demonstrable pulmonary embolus. There is no thoracic aortic aneurysm or dissection. The visualized great vessels appear normal. There is cardiomegaly. There is no pericardial effusion or pericardial thickening. The main pulmonary outflow tract measures 3.6 cm, abnormally prominent. Mediastinum/Nodes: Visualized thyroid appears unremarkable. There are multiple subcentimeter mediastinal lymph nodes. There is a right pretracheal lymph node measuring 1.2 x 1.2 cm. No esophageal lesions are evident. Lungs/Pleura: There is a moderate free-flowing right pleural effusion. There is atelectatic change in the right base. There is a nodular appearing opacity abutting the pleura in the right lower lobe measuring 2.1 x 2.1 cm, best appreciated on axial slice 79 series 6, coronal slice 125 series 9, and sagittal slice 63 series 10. right lung is clear. Upper Abdomen: There is reflux of contrast into the inferior vena cava and  hepatic veins. Visualized upper abdominal structures otherwise appear unremarkable. Musculoskeletal: No blastic or lytic bone lesions. No evident chest wall lesions. Review of the MIP images confirms the above findings. IMPRESSION: 1. No demonstrable pulmonary embolus. No thoracic aortic aneurysm or dissection. 2. Enlarged main pulmonary outflow tract, a finding indicative of pulmonary arterial hypertension. 3. Cardiomegaly. Reflux of contrast into the inferior vena cava and hepatic veins may well represent a degree of increase in right heart pressure. 4.  Right pleural effusion.  Right base atelectasis. 5. 2.1 x 2.1 cm nodular appearing opacity abutting the pleura in the right lower lobe. Suspect rounded atelectasis. Atypical pneumonia is a  differential consideration. Neoplasm is a third differential consideration. Consider one of the following in 3 months for both low-risk and high-risk individuals: (a) repeat chest CT, (b) follow-up PET-CT, or (c) tissue sampling. This recommendation follows the consensus statement: Guidelines for Management of Incidental Pulmonary Nodules Detected on CT Images: From the Fleischner Society 2017; Radiology 2017; 284:228-243. It may well be reasonable to proceed with PET-CT at this time given this nodular opacity as well as prominent right pretracheal lymph node. 6. Mildly enlarged right pretracheal lymph node. No other adenopathy by size criteria. Electronically Signed   By: Bretta BangWilliam  Woodruff III M.D.   On: 05/02/2019 20:01   [place] Chest (pleural Effusion)  Result Date: 05/03/2019 CLINICAL DATA:  Right pleural effusion. EXAM: CHEST ULTRASOUND COMPARISON:  CTA of the chest on 05/02/2019 FINDINGS: By ultrasound there is only a very small amount of pleural fluid in the posterior right pleural space. There was not enough fluid present to allow for safe thoracentesis. IMPRESSION: Small right pleural effusion by ultrasound. There was not enough fluid present to allow for safe  thoracentesis. Electronically Signed   By: Irish LackGlenn  Yamagata M.D.   On: 05/03/2019 11:46   Dg Chest Port 1 View  Result Date: 05/04/2019 CLINICAL DATA:  Shortness of breath. EXAM: PORTABLE CHEST 1 VIEW COMPARISON:  CT 05/02/2019.  Chest x-ray 05/02/2019. FINDINGS: Cardiomegaly. Partial clearing of interstitial prominence suggesting clearing interstitial edema. No pleural effusion or pneumothorax. Identified peripheral density over the right lung base best identified by prior CT. Reference is made to recent CT report. IMPRESSION: 1. Severe cardiomegaly again noted. Partial clearing of mild bilateral interstitial prominence consistent with clearing CHF. 2. Previously identified rounded pleural-based density in the right best identified by prior CT. Rest of this is made to prior CT report. Electronically Signed   By: Maisie Fushomas  Register   On: 05/04/2019 06:35   Vas [place] Lower Extremity Venous (dvt)  Result Date: 05/03/2019  Lower Venous Study Indications: Swelling.  Limitations: Body habitus and poor ultrasound/tissue interface. Comparison Study: No prior study. Performing Technologist: Gertie FeyMichelle Simonetti MHA, RDMS, RVT, RDCS  Examination Guidelines: A complete evaluation includes B-mode imaging, spectral Doppler, color Doppler, and power Doppler as needed of all accessible portions of each vessel. Bilateral testing is considered an integral part of a complete examination. Limited examinations for reoccurring indications may be performed as noted.  +---------+---------------+---------+-----------+----------+--------------+  RIGHT     Compressibility Phasicity Spontaneity Properties Summary         +---------+---------------+---------+-----------+----------+--------------+  CFV       Full            No        Yes                    pulsatile flow  +---------+---------------+---------+-----------+----------+--------------+  SFJ       Full                                                              +---------+---------------+---------+-----------+----------+--------------+  FV Prox   Full                                                             +---------+---------------+---------+-----------+----------+--------------+  FV Mid    Full                                                             +---------+---------------+---------+-----------+----------+--------------+  FV Distal                                                  Not visualized  +---------+---------------+---------+-----------+----------+--------------+  PFV                                                        Not visualized  +---------+---------------+---------+-----------+----------+--------------+  POP       Full            No        Yes                    pulsatile flow  +---------+---------------+---------+-----------+----------+--------------+  PTV       Full                                                             +---------+---------------+---------+-----------+----------+--------------+  PERO      Full                                                             +---------+---------------+---------+-----------+----------+--------------+   +---------+---------------+---------+-----------+----------+--------------+  LEFT      Compressibility Phasicity Spontaneity Properties Summary         +---------+---------------+---------+-----------+----------+--------------+  CFV       Full            No        Yes                    pulsatile flow  +---------+---------------+---------+-----------+----------+--------------+  SFJ       Full                                                             +---------+---------------+---------+-----------+----------+--------------+  FV Prox   Full                                                             +---------+---------------+---------+-----------+----------+--------------+  FV Mid  Not visualized   +---------+---------------+---------+-----------+----------+--------------+  FV Distal                                                  Not visualized  +---------+---------------+---------+-----------+----------+--------------+  PFV                                                        Not visualized  +---------+---------------+---------+-----------+----------+--------------+  POP       Full            No        Yes                    pulsatile flow  +---------+---------------+---------+-----------+----------+--------------+  PTV                                                        Not visualized  +---------+---------------+---------+-----------+----------+--------------+  PERO                                                       Not visualized  +---------+---------------+---------+-----------+----------+--------------+   Summary: Right: There is no evidence of deep vein thrombosis in the lower extremity. Portions of this examination were limited- see technologist comments above. Left: No evidence of deep vein thrombosis in the lower extremity. Portions of this examination were limited- see technologist comments above. Bilateral lower extremity pulsatile venous flow is suggestive of elevated right heart pressure.  *See table(s) above for measurements and observations. Electronically signed by Ruta Hinds MD on 05/03/2019 at 4:02:07 PM.    Final     Cardiac Studies   Echocardiogram 05/03/2019 IMPRESSIONS  1. The left ventricle has severely reduced systolic function, with an ejection fraction of 25-30%. The cavity size was moderately dilated. Left ventricular diastolic Doppler parameters are consistent with pseudonormalization. Left ventricular diffuse  hypokinesis.  2. The right ventricle has mildly reduced systolic function. The cavity was mildly enlarged. There is no increase in right ventricular wall thickness.  3. Left atrial size was mildly dilated.  4. Right atrial size was mildly dilated.  5.  Trivial pericardial effusion is present.  6. No evidence of mitral valve stenosis. Trivial mitral regurgitation.  7. The aortic valve is tricuspid. No stenosis of the aortic valve.  8. The aorta is normal in size and structure.  9. The aortic root is normal in size and structure. 10. The inferior vena cava was dilated in size with <50% respiratory variability. PA systolic pressure 37 mmHg.   Patient Profile     33 y.o. male with a hx of morbid obesity, asthma, prior smoker who is being seen today for the evaluation of dyspnea and lower extremity edema. Hypertensive, with no prior history. Found to have severely decreased LV EF 25-30%. Suspect NICM.    Assessment & Plan    Acute combined systolic and diastolic heart  failure -Patient with about 6 months of increasing edema and cough. -No history of hypertension but patient presented with significantly elevated blood pressures. -Echocardiogram with finding of severe LV dysfunction, EF about 30% with global hypokinesis.  Suspected nonischemic cardiomyopathy. -Carvedilol 12.5 mg twice Michael and Entresto 24-26 mg twice Michael initiated. -Plan for outpatient right and left heart cath to further evaluate his decreased EF. -Advised on stopping alcohol use.  Continued smoking cessation advised. -Patient is being diuresed with Lasix 80 mg IV, increased yesterday with improved urine output.  Patient had 2.8 L urine output yesterday and is now net negative 2.6 L fluid balance. -Renal function stable with serum creatinine 0.99. -HIV test nonreactive.  Thyroid function normal. -Lungs are clear and breathing seems to be improved, still has some cough at night.  He still has significant bilateral leg edema. -Continue current diuresis with plan to change to oral dosing prior to discharge.  Essential hypertension -BP was significantly elevated on presentation, 176/119.  Not previously treated at home.  Blood pressure has been mildly elevated at urgent care  in June. -Now started on carvedilol and Entresto.  Lasix for diuresis. -Blood pressures improved.  Continue current therapy.  Hypokalemia -Potassium down to 3.1 today, magnesium 1.6.  Potassium supplementation per primary team.  We will give IV magnesium supplementation, 2 g.  Alcohol use -Patient reports 2-3 liquor drinks approximately 2-3 times per week.  Patient advised to avoid alcohol use.  Tobacco abuse -Prior smoker ~half pack per day since 2006.  Quit about a month ago due to current symptoms.  Patient strongly advised to continue cessation.  He has confidence that he can.  Obesity -Body mass index is 48.9 kg/m. -Advised on weight loss with diet and exercise.  Anemia -Hemoglobin in the 10 range.  Work-up per primary team.    For questions or updates, please contact [facility] Please consult [email or web address] for contact info under        Signed, Berton Bon, NP  05/04/2019, 11:18 AM    History and all data above reviewed.  Patient examined.  I agree with the findings as above.  Feels OK.  Breathing OK.  No pain.    The patient exam reveals COR:RRR  ,  Lungs: Clear  ,  Abd: Positive bowel sounds, no rebound no guarding, Ext Mild edema bilateral legs  .  All available labs, radiology testing, previous records reviewed. Agree with documented assessment and plan. Acute systolic HF: Meds titrated yesterday.  Keep today for continued IV diuresis.  Probably home in the AM with plans for out patient evaluation (right and left heart cath.)  Discussed salt and fluid management.    Fayrene Fearing Crystallee Werden  2:09 PM  05/04/2019

## 2019-05-05 ENCOUNTER — Telehealth: Payer: Self-pay | Admitting: Cardiology

## 2019-05-05 ENCOUNTER — Telehealth: Payer: Self-pay | Admitting: Adult Health

## 2019-05-05 LAB — COMPREHENSIVE METABOLIC PANEL
ALT: 18 U/L (ref 0–44)
AST: 25 U/L (ref 15–41)
Albumin: 3.4 g/dL — ABNORMAL LOW (ref 3.5–5.0)
Alkaline Phosphatase: 67 U/L (ref 38–126)
Anion gap: 14 (ref 5–15)
BUN: 10 mg/dL (ref 6–20)
CO2: 28 mmol/L (ref 22–32)
Calcium: 8.7 mg/dL — ABNORMAL LOW (ref 8.9–10.3)
Chloride: 101 mmol/L (ref 98–111)
Creatinine, Ser: 0.97 mg/dL (ref 0.61–1.24)
GFR calc Af Amer: >60 mL/min (ref >60)
GFR calc non Af Amer: >60 mL/min (ref >60)
Glucose, Bld: 94 mg/dL (ref 70–99)
Potassium: 3.2 mmol/L — ABNORMAL LOW (ref 3.5–5.1)
Sodium: 143 mmol/L (ref 135–145)
Total Bilirubin: 1.2 mg/dL (ref 0.3–1.2)
Total Protein: 7.1 g/dL (ref 6.5–8.1)

## 2019-05-05 LAB — MAGNESIUM: Magnesium: 1.8 mg/dL (ref 1.7–2.4)

## 2019-05-05 LAB — CBC
HCT: 36.4 % — ABNORMAL LOW (ref 39.0–52.0)
Hemoglobin: 10.9 g/dL — ABNORMAL LOW (ref 13.0–17.0)
MCH: 25.5 pg — ABNORMAL LOW (ref 26.0–34.0)
MCHC: 29.9 g/dL — ABNORMAL LOW (ref 30.0–36.0)
MCV: 85.2 fL (ref 80.0–100.0)
Platelets: 215 10*3/uL (ref 150–400)
RBC: 4.27 MIL/uL (ref 4.22–5.81)
RDW: 18 % — ABNORMAL HIGH (ref 11.5–15.5)
WBC: 8.1 10*3/uL (ref 4.0–10.5)
nRBC: 0 % (ref 0.0–0.2)

## 2019-05-05 LAB — PROCALCITONIN: Procalcitonin: <0.1 ng/mL

## 2019-05-05 MED ORDER — AMOXICILLIN-POT CLAVULANATE 875-125 MG PO TABS: 1.0000 | ORAL_TABLET | Freq: Two times a day (BID) | ORAL | 0 refills | Status: AC

## 2019-05-05 MED ORDER — POTASSIUM CHLORIDE CRYS ER 20 MEQ PO TBCR
40.0000 meq | EXTENDED_RELEASE_TABLET | ORAL | Status: AC
  Administered 2019-05-05: 40 meq via ORAL
  Filled 2019-05-05 (×2): qty 2

## 2019-05-05 MED ORDER — POTASSIUM CHLORIDE CRYS ER 20 MEQ PO TBCR: 40.0000 meq | EXTENDED_RELEASE_TABLET | Freq: Every day | ORAL | 2 refills | Status: AC

## 2019-05-05 MED ORDER — POTASSIUM CHLORIDE CRYS ER 20 MEQ PO TBCR
40.0000 meq | EXTENDED_RELEASE_TABLET | Freq: Once | ORAL | Status: AC
  Administered 2019-05-05: 40 meq via ORAL

## 2019-05-05 MED ORDER — CARVEDILOL 12.5 MG PO TABS: 12.5000 mg | ORAL_TABLET | Freq: Two times a day (BID) | ORAL | 3 refills | Status: AC

## 2019-05-05 MED ORDER — SACUBITRIL-VALSARTAN 24-26 MG PO TABS: 1.0000 | ORAL_TABLET | Freq: Two times a day (BID) | ORAL | 2 refills | Status: AC

## 2019-05-05 MED ORDER — FUROSEMIDE 80 MG PO TABS: 80.0000 mg | ORAL_TABLET | Freq: Every day | ORAL | 11 refills | Status: AC

## 2019-05-05 NOTE — Discharge Instructions (Signed)
Heart Failure, Self Care °Heart failure is a serious condition. This sheet explains things you need to do to take care of yourself at home. To help you stay as healthy as possible, you may be asked to change your diet, take certain medicines, and make other changes in your life. Your doctor may also give you more specific instructions. If you have problems or questions, call your doctor. °What are the risks? °Having heart failure makes it more likely for you to have some problems. These problems can get worse if you do not take good care of yourself. Problems may include: °· Blood clotting problems. This may cause a stroke. °· Damage to the kidneys, liver, or lungs. °· Abnormal heart rhythms. °Supplies needed: °· Scale for weighing yourself. °· Blood pressure monitor. °· Notebook. °· Medicines. °How to care for yourself when you have heart failure °Medicines °Take over-the-counter and prescription medicines only as told by your doctor. Take your medicines every day. °· Do not stop taking your medicine unless your doctor tells you to do so. °· Do not skip any medicines. °· Get your prescriptions refilled before you run out of medicine. This is important. °Eating and drinking ° °· Eat heart-healthy foods. Talk with a diet specialist (dietitian) to create an eating plan. °· Choose foods that: °? Have no trans fat. °? Are low in saturated fat and cholesterol. °· Choose healthy foods, such as: °? Fresh or frozen fruits and vegetables. °? Fish. °? Low-fat (lean) meats. °? Legumes, such as beans, peas, and lentils. °? Fat-free or low-fat dairy products. °? Whole-grain foods. °? High-fiber foods. °· Limit salt (sodium) if told by your doctor. Ask your diet specialist to tell you which seasonings are healthy for your heart. °· Cook in healthy ways instead of frying. Healthy ways of cooking include roasting, grilling, broiling, baking, poaching, steaming, and stir-frying. °· Limit how much fluid you drink, if told by your  doctor. °Alcohol use °· Do not drink alcohol if: °? Your doctor tells you not to drink. °? Your heart was damaged by alcohol, or you have very bad heart failure. °? You are pregnant, may be pregnant, or are planning to become pregnant. °· If you drink alcohol: °? Limit how much you use to: °§ 0-1 drink a day for women. °§ 0-2 drinks a day for men. °? Be aware of how much alcohol is in your drink. In the [place] one drink equals one 12 oz bottle of beer (355 mL), one 5 oz glass of wine (148 mL), or one 1½ oz glass of hard liquor (44 mL). °Lifestyle ° °· Do not use any products that contain nicotine or tobacco, such as cigarettes, e-cigarettes, and chewing tobacco. If you need help quitting, ask your doctor. °? Do not use nicotine gum or patches before talking to your doctor. °· Do not use illegal drugs. °· Lose weight if told by your doctor. °· Do physical activity if told by your doctor. Talk to your doctor before you begin an exercise if: °? You are an older adult. °? You have very bad heart failure. °· Learn to manage stress. If you need help, ask your doctor. °· Get rehab (rehabilitation) to help you stay independent and to help with your quality of life. °· Plan time to rest when you get tired. °Check weight and blood pressure ° °· Weigh yourself every day. This will help you to know if fluid is building up in your body. °? Weigh yourself every morning   after you pee (urinate) and before you eat breakfast. °? Wear the same amount of clothing each time. °? Write down your daily weight. Give your record to your doctor. °· Check and write down your blood pressure as told by your doctor. °· Check your pulse as told by your doctor. °Dealing with very hot and very cold weather °· If it is very hot: °? Avoid activities that take a lot of energy. °? Use air conditioning or fans, or find a cooler place. °? Avoid caffeine and alcohol. °? Wear clothing that is loose-fitting, lightweight, and light-colored. °· If it is very  cold: °? Avoid activities that take a lot of energy. °? Layer your clothes. °? Wear mittens or gloves, a hat, and a scarf when you go outside. °? Avoid alcohol. °Follow these instructions at home: °· Stay up to date with shots (vaccines). Get pneumococcal and flu (influenza) shots. °· Keep all follow-up visits as told by your doctor. This is important. °Contact a doctor if: °· You gain weight quickly. °· You have increasing shortness of breath. °· You cannot do your normal activities. °· You get tired easily. °· You cough a lot. °· You don't feel like eating or feel like you may vomit (nauseous). °· You become puffy (swell) in your hands, feet, ankles, or belly (abdomen). °· You cannot sleep well because it is hard to breathe. °· You feel like your heart is beating fast (palpitations). °· You get dizzy when you stand up. °Get help right away if: °· You have trouble breathing. °· You or someone else notices a change in your behavior, such as having trouble staying awake. °· You have chest pain or discomfort. °· You pass out (faint). °These symptoms may be an emergency. Do not wait to see if the symptoms will go away. Get medical help right away. Call your local emergency services (911 in the [place] Do not drive yourself to the hospital. °Summary °· Heart failure is a serious condition. To care for yourself, you may have to change your diet, take medicines, and make other lifestyle changes. °· Take your medicines every day. Do not stop taking them unless your doctor tells you to do so. °· Eat heart-healthy foods, such as fresh or frozen fruits and vegetables, fish, lean meats, legumes, fat-free or low-fat dairy products, and whole-grain or high-fiber foods. °· Ask your doctor if you can drink alcohol. You may have to stop alcohol use if you have very bad heart failure. °· Contact your doctor if you gain weight quickly or feel that your heart is beating too fast. Get help right away if you pass out, or have chest pain  or trouble breathing. °This information is not intended to replace advice given to you by your health care provider. Make sure you discuss any questions you have with your health care provider. °Document Released: 12/30/2018 Document Revised: 12/29/2018 Document Reviewed: 12/30/2018 °Elsevier Patient Education © 2020 [employer] ° °

## 2019-05-05 NOTE — Progress Notes (Signed)
Appointment with Patient Michael Haynes on 05/18/2019 at 10:00 AM made and pt is aware.

## 2019-05-05 NOTE — Progress Notes (Signed)
Entresto free trail card given to pt.

## 2019-05-05 NOTE — Telephone Encounter (Signed)
LVM for patient to call back and schedule a TOC appointment with Dr. Percival Spanish.

## 2019-05-05 NOTE — Progress Notes (Signed)
Patient discharged home.  IV removed - WNL.  Reviewed AVS and medications.  HF packet given and reviewed by patient.  Stressed importance of daily weighing. Taking medications, and low sodium diet.  Patient verbalizes understanding of all instructions.  Follow up made with a PCP, patient making own appointments for cardio and pulmonology.  No questions at this time, patient in NAD.  Assisted off unit by NT

## 2019-05-05 NOTE — Discharge Summary (Addendum)
Physician Discharge Summary  Michael Haynes ZOX:[id number] DOB: Apr 30, 1986 DOA: 05/02/2019  PCP: Patient, No Pcp Per  Admit date: 05/02/2019 Discharge date: 05/05/2019  Time spent: 40* minutes  Recommendations for Outpatient Follow-up:  1. Follow-up cardiology in 1 week 2. Follow-up pulmonology for further evaluation of pulmonary nodule.  Patient has been given information to call pulmonology as outpatient for follow-up of pulmonary nodule.   Discharge Diagnoses:  Principal Problem:   Dyspnea Active Problems:   Edema   Pulmonary hypertension (HCC)   Asthma   Obesity, Class III, BMI 40-49.9 (morbid obesity) (HCC)   Hypokalemia   Tobacco abuse   Heart failure (HCC)   Acute combined systolic and diastolic heart failure (HCC)   Discharge Condition: Stable  Diet recommendation: Heart healthy diet  Filed Weights   05/03/19 0025 05/04/19 0510 05/05/19 0620  Weight: (!) 161.7 kg (!) 159 kg (!) 153.1 kg    History of present illness:  33 year old male with morbid obesity, asthma came to ED with complaints of shortness of breath for several months also worsening lower extremity edema.  CTA chest showed no pulmonary embolus, showed enlarged main pulmonary outflow tract indicative of pulmonary artery hypertension.  Cardiomegaly, right pleural effusion.  2.1 x 2.1 cm nodular appearing opacity abutting the pleura of the right lower lobe.  Hospital Course:  Acute systolic CHF-echocardiogram showed EF 25 to 30%, cavity size was moderately dilated, diffuse left ventricular hypokinesis.  Cardiology was consulted, patient started on IV Lasix and diuresed 5.7 L in the hospital.  Nonischemic cardiomyopathy-patient's new onset systolic CHF is likely from nonischemic cardiomyopathy.  Cardiology has started patient on Coreg, Entresto, Lasix 80 mg daily, potassium supplementation.  Plan for right left and right heart cath as outpatient.  Pulmonary nodule-CT chest showed right lower lobe nodular  opacity abutting the right lower lobe.  Patient empirically started on antibiotics ceftriaxone Zithromax.  Today is day #4 of antibiotics.  Will discharge patient on 2 more days of Augmentin.  Patient will need repeat CT chest versus PET CT or tissue sampling in 3 months.  Pulmonology follow-up information placed in the discharge instructions.  I discussed with patient regarding follow-up with pulmonology.  Patient is agreeable to follow-up with pulmonology.  Right pleural effusion-not enough for thoracentesis, follow imaging intermittently.  Hypertension-continue carvedilol, Entresto  Hypokalemia-potassium was 3.2 this morning, K. Dur 40 mEq p.o. x2 given in the hospital.  Patient will be discharged on potassium 40 meq p.o. daily.  He will follow-up with cardiology in 1 week.  Procedures:  Echocardiogram  Consultations:  Cardiology  Discharge Exam: Vitals:   05/05/19 0620 05/05/19 1315  BP: 125/82 120/66  Pulse: 96 89  Resp: 20 19  Temp: 98.5 F (36.9 C) 97.6 F (36.4 C)  SpO2: 92% 100%    General: Appears in no acute distress Cardiovascular: S1-S2, regular Respiratory: Clear to auscultation bilaterally no wheezing or crackles.  Discharge Instructions   Discharge Instructions    Diet - low sodium heart healthy   Complete by: As directed    Increase activity slowly   Complete by: As directed      Allergies as of 05/05/2019   No Known Allergies     Medication List    TAKE these medications   albuterol 108 (90 Base) MCG/ACT inhaler Commonly known as: VENTOLIN HFA Inhale 2 puffs into the lungs every 6 (six) hours as needed for wheezing. What changed: Another medication with the same name was removed. Continue taking this medication, and follow the  directions you see here.   amoxicillin-clavulanate 875-125 MG tablet Commonly known as: Augmentin Take 1 tablet by mouth 2 (two) times daily for 2 days.   carvedilol 12.5 MG tablet Commonly known as: COREG Take 1  tablet (12.5 mg total) by mouth 2 (two) times daily with a meal.   furosemide 80 MG tablet Commonly known as: Lasix Take 1 tablet (80 mg total) by mouth daily.   potassium chloride SA 20 MEQ tablet Commonly known as: K-DUR Take 2 tablets (40 mEq total) by mouth daily.   sacubitril-valsartan 24-26 MG Commonly known as: ENTRESTO Take 1 tablet by mouth 2 (two) times daily.      No Known Allergies Follow-up Information    Rollene RotundaHochrein, James, MD Follow up.   Specialty: Cardiology Contact information: [address] [place] [id number] [phone]        Oretha MilchAlva, Rakesh V, MD Follow up.   Specialty: Pulmonary Disease Why: Call for an appointment to follow up the pulmonary nodule.  You'll need repeat imaging with pulmonology. Contact information: [address] [place] [id number] [phone]            The results of significant diagnostics from this hospitalization (including imaging, microbiology, ancillary and laboratory) are listed below for reference.    Significant Diagnostic Studies: Dg Chest 2 View  Result Date: 05/02/2019 CLINICAL DATA:  Cough and shortness of breath.  Left arm tingling. EXAM: CHEST - 2 VIEW COMPARISON:  03/27/2013 FINDINGS: Cardiac silhouette is mildly enlarged. No mediastinal or hilar masses. No evidence of adenopathy. Lungs are clear.  No pleural effusion or pneumothorax. Skeletal structures are unremarkable. IMPRESSION: 1. No acute cardiopulmonary disease. 2. Mild cardiomegaly. Electronically Signed   By: Amie Portlandavid  Ormond M.D.   On: 05/02/2019 16:51   Ct Angio Chest Pe W And/or Wo Contrast  Result Date: 05/02/2019 CLINICAL DATA:  Tachycardia and shortness of breath EXAM: CT ANGIOGRAPHY CHEST WITH CONTRAST TECHNIQUE: Multidetector CT imaging of the chest was performed using the standard protocol during bolus administration of intravenous contrast. Multiplanar CT image reconstructions and MIPs were obtained to evaluate the  vascular anatomy. CONTRAST:  100mL OMNIPAQUE IOHEXOL 350 MG/ML SOLN COMPARISON:  Chest radiograph May 02, 2019 FINDINGS: Cardiovascular: There is no demonstrable pulmonary embolus. There is no thoracic aortic aneurysm or dissection. The visualized great vessels appear normal. There is cardiomegaly. There is no pericardial effusion or pericardial thickening. The main pulmonary outflow tract measures 3.6 cm, abnormally prominent. Mediastinum/Nodes: Visualized thyroid appears unremarkable. There are multiple subcentimeter mediastinal lymph nodes. There is a right pretracheal lymph node measuring 1.2 x 1.2 cm. No esophageal lesions are evident. Lungs/Pleura: There is a moderate free-flowing right pleural effusion. There is atelectatic change in the right base. There is a nodular appearing opacity abutting the pleura in the right lower lobe measuring 2.1 x 2.1 cm, best appreciated on axial slice 79 series 6, coronal slice 125 series 9, and sagittal slice 63 series 10. right lung is clear. Upper Abdomen: There is reflux of contrast into the inferior vena cava and hepatic veins. Visualized upper abdominal structures otherwise appear unremarkable. Musculoskeletal: No blastic or lytic bone lesions. No evident chest wall lesions. Review of the MIP images confirms the above findings. IMPRESSION: 1. No demonstrable pulmonary embolus. No thoracic aortic aneurysm or dissection. 2. Enlarged main pulmonary outflow tract, a finding indicative of pulmonary arterial hypertension. 3. Cardiomegaly. Reflux of contrast into the inferior vena cava and hepatic veins may well represent a degree of  increase in right heart pressure. 4.  Right pleural effusion.  Right base atelectasis. 5. 2.1 x 2.1 cm nodular appearing opacity abutting the pleura in the right lower lobe. Suspect rounded atelectasis. Atypical pneumonia is a differential consideration. Neoplasm is a third differential consideration. Consider one of the following in 3 months for  both low-risk and high-risk individuals: (a) repeat chest CT, (b) follow-up PET-CT, or (c) tissue sampling. This recommendation follows the consensus statement: Guidelines for Management of Incidental Pulmonary Nodules Detected on CT Images: From the Fleischner Society 2017; Radiology 2017; 284:228-243. It may well be reasonable to proceed with PET-CT at this time given this nodular opacity as well as prominent right pretracheal lymph node. 6. Mildly enlarged right pretracheal lymph node. No other adenopathy by size criteria. Electronically Signed   By: Lowella Grip III M.D.   On: 05/02/2019 20:01   [place] Chest (pleural Effusion)  Result Date: 05/03/2019 CLINICAL DATA:  Right pleural effusion. EXAM: CHEST ULTRASOUND COMPARISON:  CTA of the chest on 05/02/2019 FINDINGS: By ultrasound there is only a very small amount of pleural fluid in the posterior right pleural space. There was not enough fluid present to allow for safe thoracentesis. IMPRESSION: Small right pleural effusion by ultrasound. There was not enough fluid present to allow for safe thoracentesis. Electronically Signed   By: Aletta Edouard M.D.   On: 05/03/2019 11:46   Dg Chest Port 1 View  Result Date: 05/04/2019 CLINICAL DATA:  Shortness of breath. EXAM: PORTABLE CHEST 1 VIEW COMPARISON:  CT 05/02/2019.  Chest x-ray 05/02/2019. FINDINGS: Cardiomegaly. Partial clearing of interstitial prominence suggesting clearing interstitial edema. No pleural effusion or pneumothorax. Identified peripheral density over the right lung base best identified by prior CT. Reference is made to recent CT report. IMPRESSION: 1. Severe cardiomegaly again noted. Partial clearing of mild bilateral interstitial prominence consistent with clearing CHF. 2. Previously identified rounded pleural-based density in the right best identified by prior CT. Rest of this is made to prior CT report. Electronically Signed   By: Marcello Moores  Register   On: 05/04/2019 06:35   Vas [place] Lower  Extremity Venous (dvt)  Result Date: 05/03/2019  Lower Venous Study Indications: Swelling.  Limitations: Body habitus and poor ultrasound/tissue interface. Comparison Study: No prior study. Performing Technologist: Maudry Mayhew MHA, RDMS, RVT, RDCS  Examination Guidelines: A complete evaluation includes B-mode imaging, spectral Doppler, color Doppler, and power Doppler as needed of all accessible portions of each vessel. Bilateral testing is considered an integral part of a complete examination. Limited examinations for reoccurring indications may be performed as noted.  +---------+---------------+---------+-----------+----------+--------------+ RIGHT    CompressibilityPhasicitySpontaneityPropertiesSummary        +---------+---------------+---------+-----------+----------+--------------+ CFV      Full           No       Yes                  pulsatile flow +---------+---------------+---------+-----------+----------+--------------+ SFJ      Full                                                        +---------+---------------+---------+-----------+----------+--------------+ FV Prox  Full                                                        +---------+---------------+---------+-----------+----------+--------------+  FV Mid   Full                                                        +---------+---------------+---------+-----------+----------+--------------+ FV Distal                                             Not visualized +---------+---------------+---------+-----------+----------+--------------+ PFV                                                   Not visualized +---------+---------------+---------+-----------+----------+--------------+ POP      Full           No       Yes                  pulsatile flow +---------+---------------+---------+-----------+----------+--------------+ PTV      Full                                                         +---------+---------------+---------+-----------+----------+--------------+ PERO     Full                                                        +---------+---------------+---------+-----------+----------+--------------+   +---------+---------------+---------+-----------+----------+--------------+ LEFT     CompressibilityPhasicitySpontaneityPropertiesSummary        +---------+---------------+---------+-----------+----------+--------------+ CFV      Full           No       Yes                  pulsatile flow +---------+---------------+---------+-----------+----------+--------------+ SFJ      Full                                                        +---------+---------------+---------+-----------+----------+--------------+ FV Prox  Full                                                        +---------+---------------+---------+-----------+----------+--------------+ FV Mid                                                Not visualized +---------+---------------+---------+-----------+----------+--------------+ FV Distal  Not visualized +---------+---------------+---------+-----------+----------+--------------+ PFV                                                   Not visualized +---------+---------------+---------+-----------+----------+--------------+ POP      Full           No       Yes                  pulsatile flow +---------+---------------+---------+-----------+----------+--------------+ PTV                                                   Not visualized +---------+---------------+---------+-----------+----------+--------------+ PERO                                                  Not visualized +---------+---------------+---------+-----------+----------+--------------+   Summary: Right: There is no evidence of deep vein thrombosis in the lower extremity. Portions of this examination were  limited- see technologist comments above. Left: No evidence of deep vein thrombosis in the lower extremity. Portions of this examination were limited- see technologist comments above. Bilateral lower extremity pulsatile venous flow is suggestive of elevated right heart pressure.  *See table(s) above for measurements and observations. Electronically signed by Fabienne Brunsharles Fields MD on 05/03/2019 at 4:02:07 PM.    Final     Microbiology: Recent Results (from the past 240 hour(s))  SARS Coronavirus 2 [facility](Hospital order, Performed in [facility] lab) Nasopharyngeal Nasopharyngeal Swab     Status: None   Collection Time: 05/02/19  6:47 PM   Specimen: Nasopharyngeal Swab  Result Value Ref Range Status   SARS Coronavirus 2 NEGATIVE NEGATIVE Final    Comment: (NOTE) If result is NEGATIVE SARS-CoV-2 target nucleic acids are NOT DETECTED. The SARS-CoV-2 RNA is generally detectable in upper and lower  respiratory specimens during the acute phase of infection. The lowest  concentration of SARS-CoV-2 viral copies this assay can detect is 250  copies / mL. A negative result does not preclude SARS-CoV-2 infection  and should not be used as the sole basis for treatment or other  patient management decisions.  A negative result may occur with  improper specimen collection / handling, submission of specimen other  than nasopharyngeal swab, presence of viral mutation(s) within the  areas targeted by this assay, and inadequate number of viral copies  (<250 copies / mL). A negative result must be combined with clinical  observations, patient history, and epidemiological information. If result is POSITIVE SARS-CoV-2 target nucleic acids are DETECTED. The SARS-CoV-2 RNA is generally detectable in upper and lower  respiratory specimens dur ing the acute phase of infection.  Positive  results are indicative of active infection with SARS-CoV-2.  Clinical  correlation with patient history and other diagnostic  information is  necessary to determine patient infection status.  Positive results do  not rule out bacterial infection or co-infection with other viruses. If result is PRESUMPTIVE POSTIVE SARS-CoV-2 nucleic acids MAY BE PRESENT.   A presumptive positive result was obtained on the submitted specimen  and confirmed on repeat testing.  While 2019 novel  coronavirus  (SARS-CoV-2) nucleic acids may be present in the submitted sample  additional confirmatory testing may be necessary for epidemiological  and / or clinical management purposes  to differentiate between  SARS-CoV-2 and other Sarbecovirus currently known to infect humans.  If clinically indicated additional testing with an alternate test  methodology [phone]) is advised. The SARS-CoV-2 RNA is generally  detectable in upper and lower respiratory sp ecimens during the acute  phase of infection. The expected result is Negative. Fact Sheet for Patients:  BoilerBrush.com.cy Fact Sheet for Healthcare Providers: https://pope.com/ This test is not yet approved or cleared by the [place] FDA and has been authorized for detection and/or diagnosis of SARS-CoV-2 by FDA under an Emergency Use Authorization (EUA).  This EUA will remain in effect (meaning this test can be used) for the duration of the COVID-19 declaration under Section 564(b)(1) of the Act, 21 U.S.C. section 360bbb-3(b)(1), unless the authorization is terminated or revoked sooner. Performed at [facility], [address], [place], [place] [address]   Culture, blood (routine x 2)     Status: None (Preliminary result)   Collection Time: 05/02/19 11:51 PM   Specimen: BLOOD  Result Value Ref Range Status   Specimen Description   Final    BLOOD LEFT ANTECUBITAL Performed at [facility], [address], [place], [place] [address]    Special Requests   Final    BOTTLES DRAWN AEROBIC  AND ANAEROBIC Blood Culture adequate volume Performed at [facility], [address], [place], [place] [address]    Culture   Final    NO GROWTH 2 DAYS Performed at [facility], [address], [place], [place] [address]    Report Status PENDING  Incomplete  Culture, blood (routine x 2)     Status: None (Preliminary result)   Collection Time: 05/02/19 11:51 PM   Specimen: BLOOD  Result Value Ref Range Status   Specimen Description   Final    BLOOD RIGHT ANTECUBITAL Performed at [facility], [address], [place], [place] [address]    Special Requests   Final    BOTTLES DRAWN AEROBIC AND ANAEROBIC Blood Culture adequate volume Performed at [facility], [address], [place], [place] [address]    Culture   Final    NO GROWTH 2 DAYS Performed at [facility], [address], [place], [place] [address]    Report Status PENDING  Incomplete     Labs: Basic Metabolic Panel: Recent Labs  Lab 05/02/19 1618 05/02/19 2351 05/03/19 0441 05/04/19 0403 05/05/19 0440  NA 143  --  142 141 143  K 3.3*  --  3.5 3.1* 3.2*  CL 105  --  102 102 101  CO2 25  --  GLUCOSE 102*  --  106* 92 94  BUN 7  --  CREATININE 0.95  --  1.05 0.99 0.97  CALCIUM 8.6*  --  8.6* 8.6* 8.7*  MG  --  1.8  --  1.6* 1.8   Liver Function Tests: Recent Labs  Lab 05/03/19 0441 05/04/19 0403 05/05/19 0440  AST ALT ALKPHOS 81 73 67  BILITOT 1.9* 1.5* 1.2  PROT 7.5 7.3 7.1  ALBUMIN 3.6 3.4* 3.4*   No results for input(s): LIPASE, AMYLASE in the last 168 hours. No results for input(s): AMMONIA in the last  168 hours. CBC: Recent Labs  Lab 05/02/19 1618 05/03/19 0441 05/04/19 0403 05/05/19 0440  WBC 8.9 10.0 8.4 8.1  NEUTROABS 6.8  --   --   --   HGB 10.8* 10.2* 10.8* 10.9*  HCT 35.6* 34.4* 36.2* 36.4*  MCV 84.8 85.6 85.0 85.2  PLT 220 230 256 215    BNP (last 3 results) Recent  Labs    05/02/19 1618  BNP 439.0*       Signed:  Meredeth Ide MD.  [facility] 05/05/2019, 2:18 PM

## 2019-05-05 NOTE — Telephone Encounter (Signed)
Still admitted

## 2019-05-05 NOTE — Telephone Encounter (Signed)
New Message     Pt has TOC appt with Jory Sims 05/17/19 at 8:15am

## 2019-05-05 NOTE — Progress Notes (Addendum)
Progress Note  Patient Name: Michael Haynes Date of Encounter: 05/05/2019  Primary Cardiologist: Minus Breeding, MD   Subjective   Feeling better and back to normal baseline. No dyspnea, CP or palpitations.   Inpatient Medications    Scheduled Meds:  carvedilol  12.5 mg Oral BID WC   enoxaparin (LOVENOX) injection  60 mg Subcutaneous QHS   furosemide  80 mg Intravenous Daily   pneumococcal 23 valent vaccine  0.5 mL Intramuscular Tomorrow-1000   potassium chloride  40 mEq Oral Q4H   sacubitril-valsartan  1 tablet Oral BID   sodium chloride flush  3 mL Intravenous Q12H   Continuous Infusions:  sodium chloride     azithromycin 500 mg (05/04/19 2220)   cefTRIAXone (ROCEPHIN)  IV 1 g (05/04/19 2135)   PRN Meds: sodium chloride, acetaminophen **OR** acetaminophen, sodium chloride flush   Vital Signs    Vitals:   05/04/19 0510 05/04/19 1457 05/04/19 2115 05/05/19 0620  BP: (!) 133/91 126/76 119/85 125/82  Pulse: 100 91 95 96  Resp: 20 18 20 20   Temp: 98.3 F (36.8 C) 98 F (36.7 C) 98.2 F (36.8 C) 98.5 F (36.9 C)  TempSrc: Oral Oral Oral Oral  SpO2: 99% 100% 100% 92%  Weight: (!) 159 kg   (!) 153.1 kg  Height:        Intake/Output Summary (Last 24 hours) at 05/05/2019 1155 Last data filed at 05/05/2019 1100 Gross per 24 hour  Intake 1534.63 ml  Output 3200 ml  Net -1665.37 ml   Last 3 Weights 05/05/2019 05/04/2019 05/03/2019  Weight (lbs) 337 lb 8 oz 350 lb 9.6 oz 356 lb 7.7 oz  Weight (kg) 153.089 kg 159.031 kg 161.7 kg      Telemetry    NSR 90s. No adverse arrhthymias  - Personally Reviewed  ECG    N/A not performed today - Personally Reviewed  Physical Exam   GEN: morbidly obese, young AAM, in No acute distress.   Neck: No JVD Cardiac: RRR, no murmurs, rubs, or gallops.  Respiratory: Clear to auscultation bilaterally. GI: obese abdomen, soft, nontender, non-distended  MS: No pitting edema; chronic venous stasis dermatitis bilateral  LEs Neuro:  Nonfocal  Psych: Normal affect   Labs    High Sensitivity Troponin:   Recent Labs  Lab 05/02/19 1618 05/02/19 1810 05/03/19 0441  TROPONINIHS 19* 19* 19*      Cardiac EnzymesNo results for input(s): TROPONINI in the last 168 hours. No results for input(s): TROPIPOC in the last 168 hours.   Chemistry Recent Labs  Lab 05/03/19 0441 05/04/19 0403 05/05/19 0440  NA 142 141 143  K 3.5 3.1* 3.2*  CL 102 102 101  CO2 27 26 28   GLUCOSE 106* 92 94  BUN 9 11 10   CREATININE 1.05 0.99 0.97  CALCIUM 8.6* 8.6* 8.7*  PROT 7.5 7.3 7.1  ALBUMIN 3.6 3.4* 3.4*  AST 24 21 25   ALT 17 17 18   ALKPHOS 81 73 67  BILITOT 1.9* 1.5* 1.2  GFRNONAA >60 >60 >60  GFRAA >60 >60 >60  ANIONGAP 13 13 14      Hematology Recent Labs  Lab 05/03/19 0441 05/04/19 0403 05/05/19 0440  WBC 10.0 8.4 8.1  RBC 4.02* 4.26 4.27  HGB 10.2* 10.8* 10.9*  HCT 34.4* 36.2* 36.4*  MCV 85.6 85.0 85.2  MCH 25.4* 25.4* 25.5*  MCHC 29.7* 29.8* 29.9*  RDW 17.4* 17.6* 18.0*  PLT 230 256 215    BNP Recent Labs  Lab 05/02/19  1618  BNP 439.0*     DDimer No results for input(s): DDIMER in the last 168 hours.   Radiology    Dg Chest Port 1 View  Result Date: 05/04/2019 CLINICAL DATA:  Shortness of breath. EXAM: PORTABLE CHEST 1 VIEW COMPARISON:  CT 05/02/2019.  Chest x-ray 05/02/2019. FINDINGS: Cardiomegaly. Partial clearing of interstitial prominence suggesting clearing interstitial edema. No pleural effusion or pneumothorax. Identified peripheral density over the right lung base best identified by prior CT. Reference is made to recent CT report. IMPRESSION: 1. Severe cardiomegaly again noted. Partial clearing of mild bilateral interstitial prominence consistent with clearing CHF. 2. Previously identified rounded pleural-based density in the right best identified by prior CT. Rest of this is made to prior CT report. Electronically Signed   By: Maisie Fus  Register   On: 05/04/2019 06:35    Cardiac  Studies   Echocardiogram 05/03/2019 IMPRESSIONS 1. The left ventricle has severely reduced systolic function, with an ejection fraction of 25-30%. The cavity size was moderately dilated. Left ventricular diastolic Doppler parameters are consistent with pseudonormalization. Left ventricular diffuse  hypokinesis. 2. The right ventricle has mildly reduced systolic function. The cavity was mildly enlarged. There is no increase in right ventricular wall thickness. 3. Left atrial size was mildly dilated. 4. Right atrial size was mildly dilated. 5. Trivial pericardial effusion is present. 6. No evidence of mitral valve stenosis. Trivial mitral regurgitation. 7. The aortic valve is tricuspid. No stenosis of the aortic valve. 8. The aorta is normal in size and structure. 9. The aortic root is normal in size and structure. 10. The inferior vena cava was dilated in size with <50% respiratory variability. PA systolic pressure 37 mmHg.  Patient Profile     33 y.o. male with a hx of morbid obesity, asthma, prior smokerwho is being seen today for the evaluation of dyspnea and lower extremity edema. Hypertensive, with no prior history. Found to have severely decreased LV EF 25-30%. Suspect NICM.    Assessment & Plan    1. Acute Systolic HF: he is obese, making assessment of volume status a bit difficult but he has no peripheral pitting edema on exam and reports being back to his baseline. Feels better overall and breathing ok. No supplemental O2 requirements. He has diuresed 5.7 L since admit. BP improved. Renal function normal. Plan to d/c home on diuretic. Treating w/ Entresto. Consider adding spironolactone.  Entresto w/ its diuretic properties along with spironolactone, may be enough to control volume if he refrains from high sodium diet. May not need a standing daily dose of Lasix. May consider PRN lasix on d/c w/ guidance to take as needed based on daily weights. Will defer to MD. We discussed  recommendations for the management of chronic heart failure to control volume/symptoms and reduce risk of acute exacerbation that may necessitate hospitalization.  These measures include continuation of current diuretics with close outpatient monitoring of volume status through daily weights.  Patient advised to check weight daily and to call our office if greater than 3 pound weight gain in 24 hours or greater than 5 pound weight gain in the course of 1 week.  Patient also strongly encouraged to adhere to a low salt diet, reducing intake to less than 2 g daily. He seems to have a good understanding of these instructions.   2. Cardiomyopathy: new. EF 25-30% w/ global hypokinesis. No prior studies for comparison. H/o untreated HTN. Presented w/ initial BP of 176/119 and not on any meds prior to  admit. Suspect likely NICM 2/2 hypertension and ETOH use. However, he will need R/LHC to make diagnosis and r/o underlying CAD/ coronary ischemia as cause. Plan is to do this as an outpatient. Plan to treat w/ guidelines directed medical therapy for systolic HF. Entresto and Coreg have been initiated. He would also benefit from spironolactone, if BP tolerates. He will need gradual titration of HF regimen as outpatient to maximally tolerated dosages. Once on maximum tolerated regimen for duration of 3 months, he will need repeat echocardiogram. If EF remains < 35%, he will need referral to EP for consideration for ICD. I have reviewed tele. No adverse arrhthymias on review. He will also need aggressive risk factor modification, including better management of HTN, avoidance of alcohol, weight loss and medication adherence. Given his young age, [facility]    3. Tobacco Abuse: Prior smoker ~half pack per day since 2006.  Quit about a month ago due to current symptoms.  Patient strongly advised to continue cessation.  He has confidence that he can.  4. ETOH Abuse: Patient reports 2-3 liquor  drinks approximately 2-3 times per week. This may be contributing to his cardiomyopathy. Patient advised to avoid alcohol use.  5. Hypokalemia: 3.1 yesterday and 3.2 today. In the setting IV diuretics. He has been getting supplemental K. Considering his systolic HF, consider stopping supplemental K and adding low dose spironolactone. SBP in the 120s. Could probably tolerate 12.5 mg daily once off of IV Lasix.   6. Anemia: unsure why he is anemic. Hgb in the 10 range and stable this entire admit. No prior labs for comparison. MCV WNL. Further w/u per primary team and PCP.   7. Obesity: Body mass index is 47.07 kg/m. Weight loss advised.   8. HTN: markedly elevated on admit 176/119. No meds prior to this admission. BP improved w/ antihypertensives/ HF meds including Entresto, Coreg and IV Lasix. Consider adding spironolactone as noted above for systolic HF, HTN and persistent hypokalemia.   Appears stable from a cardiac standpoint. Suspect he can likely be discharged home to day w/ close outpatient f/u. MD to follow with final recommendations.   For questions or updates, please contact [facility] Please consult [email or web address] for contact info under        Signed, Robbie LisBrittainy Simmons, PA-C  05/05/2019, 11:55 AM    History and all data above reviewed.  Patient examined.  I agree with the findings as above. Doing OK.  Breathing back to baseline  The patient exam reveals COR:RRR  ,  Lungs: Clear  ,  Abd: Positive bowel sounds, no rebound no guarding, Ext Moderate edema.  All available labs, radiology testing, previous records reviewed. Agree with documented assessment and plan. Acute HF:  OK to send home on meds as listed and Lasix 80 mg daily.  I would also send home on KDur 40 meq daily.  We will see in the office early next week.   Fayrene FearingJames Jamarea Selner  1:33 PM  05/05/2019

## 2019-05-06 NOTE — Telephone Encounter (Signed)
Patient contacted regarding discharge from [facility] on 05/05/2019.  Patient understands to follow up with provider Jory Sims, NP on 05/17/2019 at 8:15 AM at [facility] office. Patient understands discharge instructions? yes Patient understands medications and regiment? yes Patient understands to bring all medications to this visit? Yes  Patient states however he has not been able to start his medications Entresto or Carvedilol bc he states his pharmacy told him they needed prior auth. I advised I would send a message to NP's primary to assist, as well as Erskin Burnet, CMA who helps with the PA's.

## 2019-05-08 LAB — CULTURE, BLOOD (ROUTINE X 2)
Culture: NO GROWTH
Culture: NO GROWTH
Special Requests: ADEQUATE
Special Requests: ADEQUATE

## 2019-05-12 ENCOUNTER — Telehealth: Payer: Self-pay | Admitting: Adult Health

## 2019-05-12 NOTE — Telephone Encounter (Signed)
Prior Auth send to covermymed, will await result

## 2019-05-12 NOTE — Telephone Encounter (Signed)
Prior Auth send to covermymed, will await result 

## 2019-05-12 NOTE — Telephone Encounter (Signed)
Patient called stating he still hasn't be able to start his sacubitril-valsartan (ENTRESTO) 24-26 MG, as he is still waiting on a Prior-auth for it since last week.  Please see TOC phone note.

## 2019-05-14 ENCOUNTER — Ambulatory Visit: Payer: 59 | Admitting: Family Medicine

## 2019-05-16 NOTE — Progress Notes (Deleted)
Cardiology Office Note   Date:  05/16/2019   ID:  Garrod Scalzitti, DOB 06/08/1986, MRN [id number]  PCP:  Patient, No Pcp Per  Cardiologist: Dr. Antoine Poche CC: NICM, Systolic CHF.    History of Present Illness: Michael Haynes is a 33 y.o. male who presents for posthospitalization follow-up we are following he was initially seen during consultation in August 2020 in the setting of dyspnea and lower extremity edema with echocardiogram findings of severely decreased LVEF of 25% to 30%. Other history includes morbid obesity, asthma, and hypertension.  He was diagnosed with nonischemic cardiomyopathy.  The patient was diagnosed with acute systolic heart failure in the setting of nonischemic cardiomyopathy.  He was also found to have a enlarged main pulmonary outflow tract indicative of pulmonary hypertension, a right pleural effusion, and 2.1 x 2.1 cm nodule appearing opacity abutting the pleura of the right lower lobe.    He was diuresed 5.7 L.  He was started on Entresto, continued on Lasix 80 mg daily, with consideration for adding spironolactone on follow-up if he did not have blood pressure or volume control, Additionally he was started on carvedilol, 12.5 mg twice daily and potassium supplement.Marland Kitchen    He was counseled to avoid alcohol use and continue smoking cessation.  Mr. Lin Givens was discharged on 05/05/2019.  He was to follow-up with pulmonology in the setting of the right lower lobe opacity.  He was treated with ceftriaxone and Zithromax.  It is noted that he was negative for COVID.  Weight on discharge 337 pounds (153.0 kg)  The patient did call our office on 05/06/2019 stating that he was not able to get Sherryll Burger as his insurance company needed preauthorization.  He will likely need samples on this office visit.    Past Medical History:  Diagnosis Date  . Asthma     No past surgical history on file.   Current Outpatient Medications  Medication Sig Dispense Refill  . albuterol  (PROVENTIL HFA;VENTOLIN HFA) 108 (90 Base) MCG/ACT inhaler Inhale 2 puffs into the lungs every 6 (six) hours as needed for wheezing. 1 Inhaler 11  . carvedilol (COREG) 12.5 MG tablet Take 1 tablet (12.5 mg total) by mouth 2 (two) times daily with a meal. 60 tablet 3  . furosemide (LASIX) 80 MG tablet Take 1 tablet (80 mg total) by mouth daily. 30 tablet 11  . potassium chloride SA (K-DUR) 20 MEQ tablet Take 2 tablets (40 mEq total) by mouth daily. 30 tablet 2  . sacubitril-valsartan (ENTRESTO) 24-26 MG Take 1 tablet by mouth 2 (two) times daily. 60 tablet 2   No current facility-administered medications for this visit.     Allergies:   Patient has no known allergies.    Social History:  The patient  reports that he quit smoking about 6 weeks ago. His smoking use included cigarettes. He smoked 0.00 packs per day. He has never used smokeless tobacco. He reports current alcohol use of about 6.0 standard drinks of alcohol per week. He reports that he does not use drugs.   Family History:  The patient's family history includes Diabetes in his father; Healthy in his brother and brother; Hypertension in his father.    ROS: All other systems are reviewed and negative. Unless otherwise mentioned in H&P    PHYSICAL EXAM: VS:  There were no vitals taken for this visit. , BMI There is no height or weight on file to calculate BMI. GEN: Well nourished, well developed, in no acute distress HEENT: normal  Neck: no JVD, carotid bruits, or masses Cardiac: ***RRR; no murmurs, rubs, or gallops,no edema  Respiratory:  Clear to auscultation bilaterally, normal work of breathing GI: soft, nontender, nondistended, + BS MS: no deformity or atrophy Skin: warm and dry, no rash Neuro:  Strength and sensation are intact Psych: euthymic mood, full affect   EKG:  EKG {ACTION; IS/IS LOV:[id number]} ordered today. The ekg ordered today demonstrates ***   Recent Labs: 05/02/2019: B Natriuretic Peptide 439.0  2019/05/09: TSH 2.743 05/05/2019: ALT 18; BUN 10; Creatinine, Ser 0.97; Hemoglobin 10.9; Magnesium 1.8; Platelets 215; Potassium 3.2; Sodium 143    Lipid Panel No results found for: CHOL, TRIG, HDL, CHOLHDL, VLDL, LDLCALC, LDLDIRECT    Wt Readings from Last 3 Encounters:  05/05/19 (!) 337 lb 8 oz (153.1 kg)      Other studies Reviewed: Echocardiogram 05-09-19  . The left ventricle has severely reduced systolic function, with an ejection fraction of 25-30%. The cavity size was moderately dilated. Left ventricular diastolic Doppler parameters are consistent with pseudonormalization. Left ventricular diffuse  hypokinesis.  2. The right ventricle has mildly reduced systolic function. The cavity was mildly enlarged. There is no increase in right ventricular wall thickness.  3. Left atrial size was mildly dilated.  4. Right atrial size was mildly dilated.  5. Trivial pericardial effusion is present.  6. No evidence of mitral valve stenosis. Trivial mitral regurgitation.  7. The aortic valve is tricuspid. No stenosis of the aortic valve.  8. The aorta is normal in size and structure.  9. The aortic root is normal in size and structure. 10. The inferior vena cava was dilated in size with <50% respiratory variability. PA systolic pressure 37 mmHg.  Venous Doppler Ultrasound of Lower Extremities May 09, 2019   Right: There is no evidence of deep vein thrombosis in the lower extremity. Portions of this examination were limited- see technologist comments above. Left: No evidence of deep vein thrombosis in the lower extremity. Portions of this examination were limited- see technologist comments above.  Bilateral lower extremity pulsatile venous flow is suggestive of elevated right heart pressure.    ASSESSMENT AND PLAN:  1.  ***   Current medicines are reviewed at length with the patient today.    Labs/ tests ordered today include: *** Phill Myron. West Pugh, ANP, AACC   05/16/2019 11:51 AM     [facility] [place] [address] Office [phone] Fax [phone]

## 2019-05-17 ENCOUNTER — Ambulatory Visit: Payer: 59 | Admitting: Adult Health

## 2019-05-18 ENCOUNTER — Ambulatory Visit: Payer: 59 | Admitting: Family Medicine

## 2019-05-26 ENCOUNTER — Telehealth: Payer: Self-pay

## 2019-05-26 NOTE — Telephone Encounter (Signed)
Prior Auth Approved for Rx Entresto Tab 24-26mg  (60 tabs per month) through 05/11/2020

## 2019-05-31 NOTE — Progress Notes (Deleted)
Cardiology Office Note   Date:  05/31/2019   ID:  Michael Haynes, DOB [birth year], MRN [id number]  PCP:  Patient, No Pcp Per  Cardiologist:  Dr.Hochrein  CC: [facility] F/U.    History of Present Illness: Michael Haynes is a 33 y.o. male who presents for ongoing assessment and management of chronic dyspnea and lower extremity edema, hypertension, chronic systolic CHF with an LVEF of 25% to 30% suspected nonischemic, and likely related to hypertension and EtOH abuse..  The patient was seen during hospitalization on 05/03/2019 during consultation for same.  During hospitalization the patient diuresed 5.7 L on IV diuretics.  He was started on Entresto, renal function was stable and therefore spironolactone was added, he was counseled on a low-sodium diet.  He was sent home on a daily dose of Lasix at 80 mg  advised to quit smoking, and to stop drinking alcohol. Discharge weight 337 lbs   He is  to have cardiac cath as OP,  with both Right and Left heart cath to r/o CAD as etiology of reduced EF.Marland KitchenRepeat echo in 3 months.   Past Medical History:  Diagnosis Date  . Asthma     No past surgical history on file.   Current Outpatient Medications  Medication Sig Dispense Refill  . albuterol (PROVENTIL HFA;VENTOLIN HFA) 108 (90 Base) MCG/ACT inhaler Inhale 2 puffs into the lungs every 6 (six) hours as needed for wheezing. 1 Inhaler 11  . carvedilol (COREG) 12.5 MG tablet Take 1 tablet (12.5 mg total) by mouth 2 (two) times daily with a meal. 60 tablet 3  . furosemide (LASIX) 80 MG tablet Take 1 tablet (80 mg total) by mouth daily. 30 tablet 11  . potassium chloride SA (K-DUR) 20 MEQ tablet Take 2 tablets (40 mEq total) by mouth daily. 30 tablet 2  . sacubitril-valsartan (ENTRESTO) 24-26 MG Take 1 tablet by mouth 2 (two) times daily. 60 tablet 2   No current facility-administered medications for this visit.     Allergies:   Patient has no known allergies.    Social History:  The patient   reports that he quit smoking about 8 weeks ago. His smoking use included cigarettes. He smoked 0.00 packs per day. He has never used smokeless tobacco. He reports current alcohol use of about 6.0 standard drinks of alcohol per week. He reports that he does not use drugs.   Family History:  The patient's family history includes Diabetes in his father; Healthy in his brother and brother; Hypertension in his father.    ROS: All other systems are reviewed and negative. Unless otherwise mentioned in H&P    PHYSICAL EXAM: VS:  There were no vitals taken for this visit. , BMI There is no height or weight on file to calculate BMI. GEN: Well nourished, well developed, in no acute distress HEENT: normal Neck: no JVD, carotid bruits, or masses Cardiac: ***RRR; no murmurs, rubs, or gallops,no edema  Respiratory:  Clear to auscultation bilaterally, normal work of breathing GI: soft, nontender, nondistended, + BS MS: no deformity or atrophy Skin: warm and dry, no rash Neuro:  Strength and sensation are intact Psych: euthymic mood, full affect   EKG:  EKG {ACTION; IS/IS TIR:[id number]} ordered today. The ekg ordered today demonstrates ***   Recent Labs: 05/02/2019: B Natriuretic Peptide 439.0 05/03/2019: TSH 2.743 05/05/2019: ALT 18; BUN 10; Creatinine, Ser 0.97; Hemoglobin 10.9; Magnesium 1.8; Platelets 215; Potassium 3.2; Sodium 143    Lipid Panel No results found for: CHOL, TRIG,  HDL, CHOLHDL, VLDL, LDLCALC, LDLDIRECT    Wt Readings from Last 3 Encounters:  05/05/19 (!) 337 lb 8 oz (153.1 kg)      Other studies Reviewed: Echocardiogram 05-10-19 1. The left ventricle has severely reduced systolic function, with an ejection fraction of 25-30%. The cavity size was moderately dilated. Left ventricular diastolic Doppler parameters are consistent with pseudonormalization. Left ventricular diffuse  hypokinesis.  2. The right ventricle has mildly reduced systolic function. The cavity was mildly  enlarged. There is no increase in right ventricular wall thickness.  3. Left atrial size was mildly dilated.  4. Right atrial size was mildly dilated.  5. Trivial pericardial effusion is present.  6. No evidence of mitral valve stenosis. Trivial mitral regurgitation.  7. The aortic valve is tricuspid. No stenosis of the aortic valve.  8. The aorta is normal in size and structure.  9. The aortic root is normal in size and structure. 10. The inferior vena cava was dilated in size with <50% respiratory variability. PA systolic pressure 37 mmHg.   ASSESSMENT AND PLAN:  1.  ***   Current medicines are reviewed at length with the patient today.    Labs/ tests ordered today include: *** Bettey Mare. Liborio Nixon, ANP, [facility]   05/31/2019 7:31 AM    [facility] [address] Office [phone] Fax [phone]

## 2019-06-01 ENCOUNTER — Telehealth: Payer: Self-pay

## 2019-06-01 NOTE — Telephone Encounter (Signed)
Prior Auth for Rx Entresto Tab 24-26 has been approved as directed ( 60 per month ) through 05/20/2020

## 2019-06-02 ENCOUNTER — Ambulatory Visit: Payer: 59 | Admitting: Adult Health

## 2019-08-12 IMAGING — CT CT ANGIOGRAPHY CHEST
2 of 6 series · 17 of 46 positions shown · IV contrast (omnipaque)
Comparison: Chest radiograph May 02, 2019

CLINICAL DATA: Tachycardia and shortness of breath

EXAM:
CT ANGIOGRAPHY CHEST WITH CONTRAST
TECHNIQUE: Multidetector CT imaging of the chest was performed using the
standard protocol during bolus administration of intravenous
contrast. Multiplanar CT image reconstructions and MIPs were
obtained to evaluate the vascular anatomy.
CONTRAST:  100mL OMNIPAQUE IOHEXOL 350 MG/ML SOLN

[Series 5: thins · axial · 0.79mm/px · z∈[-344,-84]mm · 14 of 286 slices shown]
[im 13/286  lung]
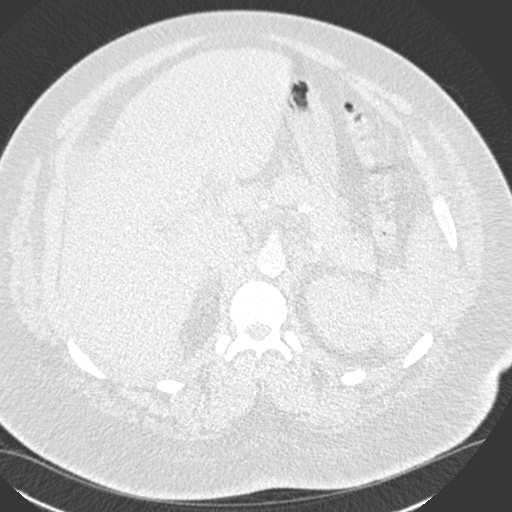
[im 38/286  soft-tissue]
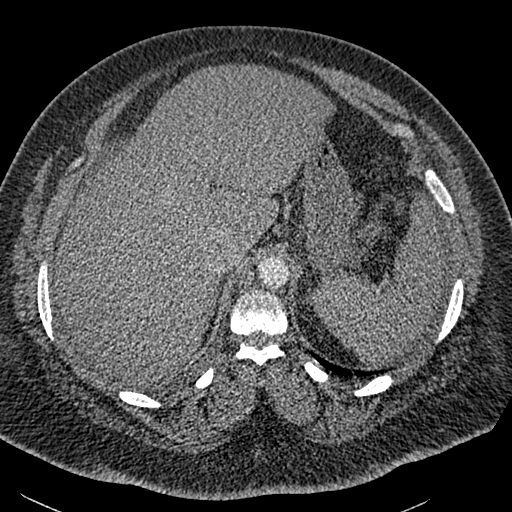
[im 50/286  lung]
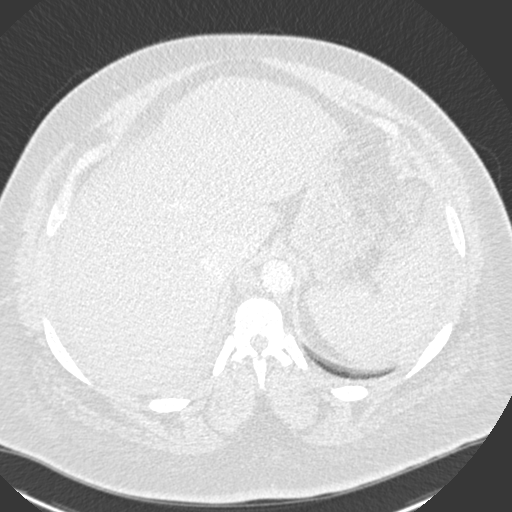
[im 75/286  soft-tissue]
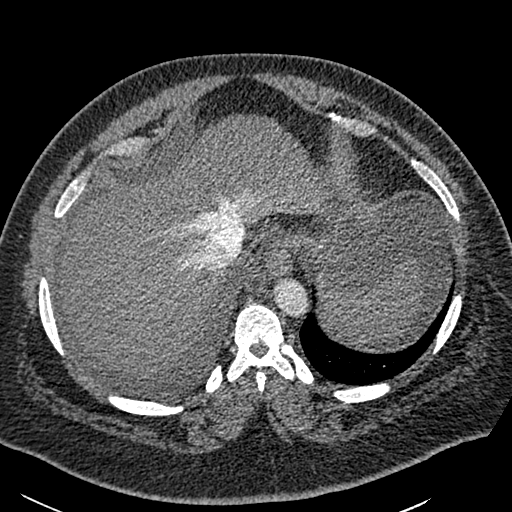
[im 100/286  lung]
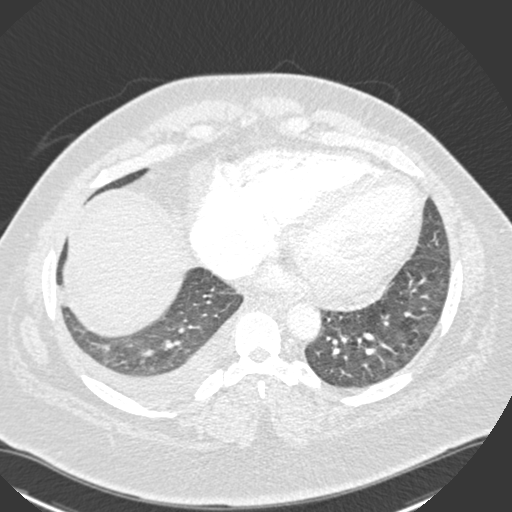
[im 112/286  soft-tissue]
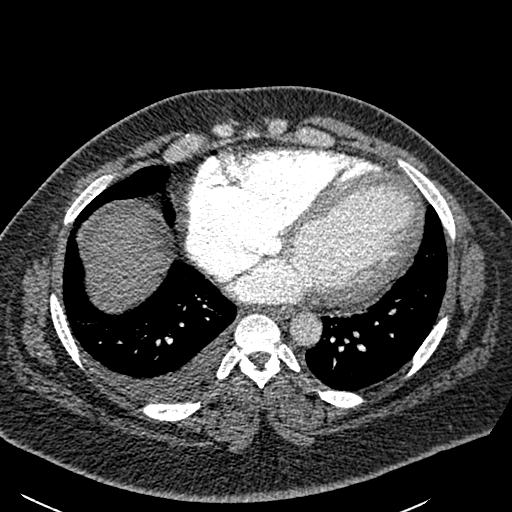
[im 137/286  lung]
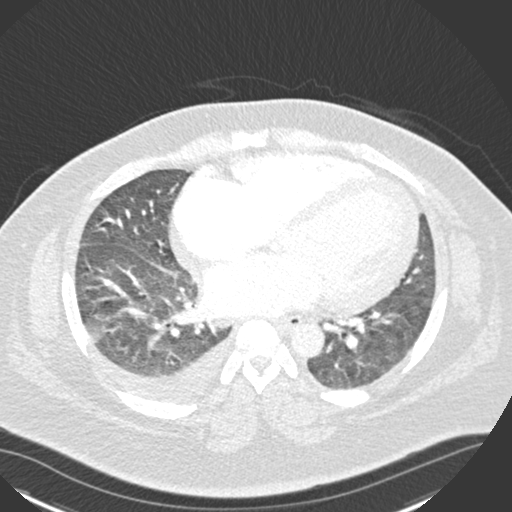
[im 149/286  soft-tissue]
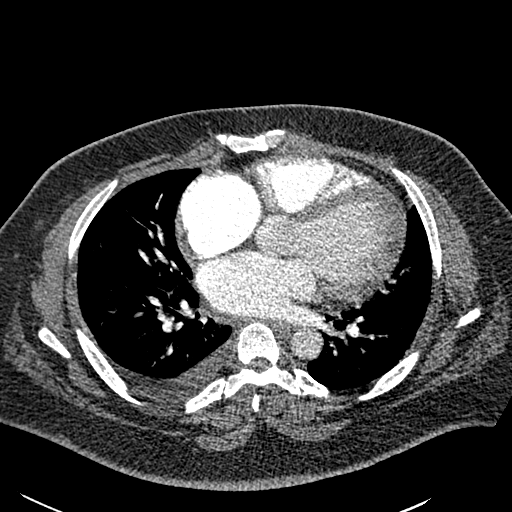
[im 174/286  lung]
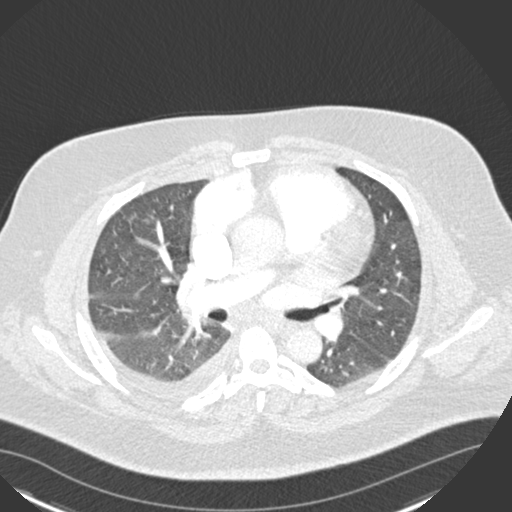
[im 186/286  soft-tissue]
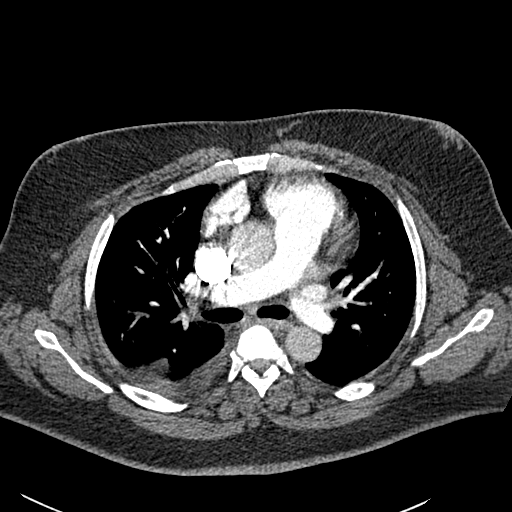
[im 211/286  lung]
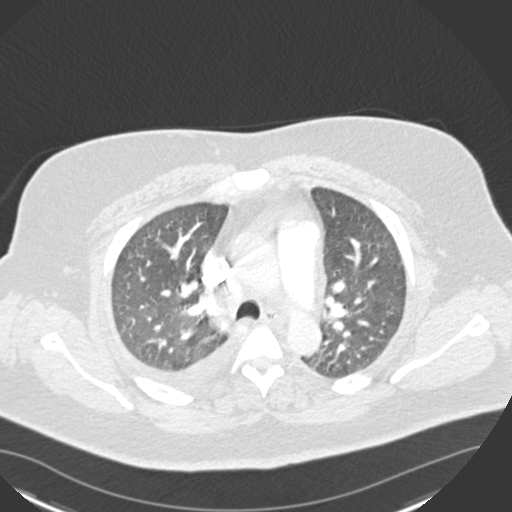
[im 236/286  soft-tissue]
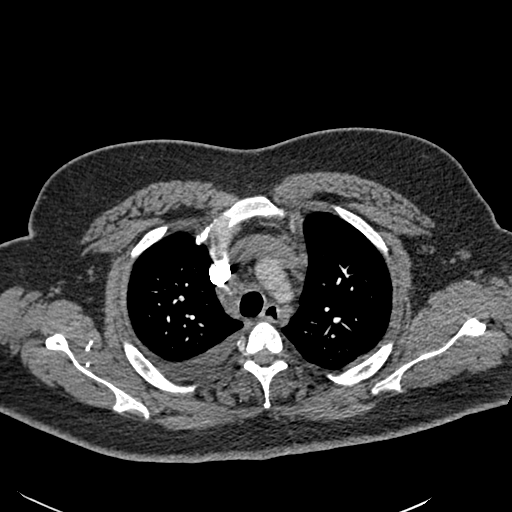
[im 248/286  lung]
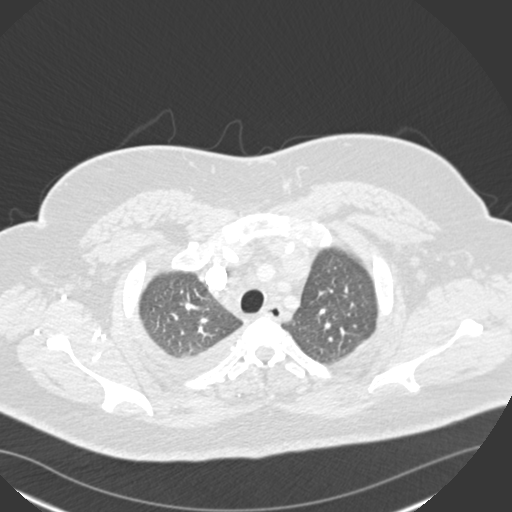
[im 273/286  soft-tissue]
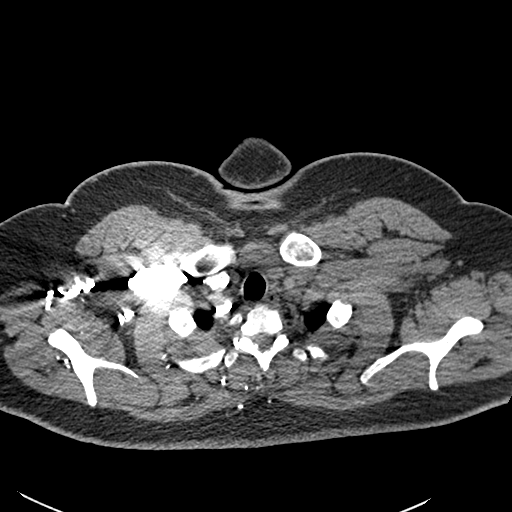

[Series 7: coronal mpr · coronal · 0.57mm/px · 3 of 169 slices shown]
[im 43/169  soft-tissue]
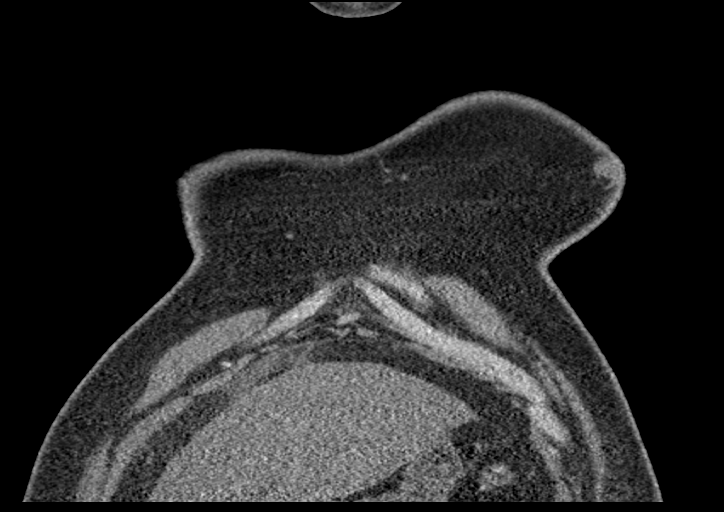
[im 85/169  soft-tissue]
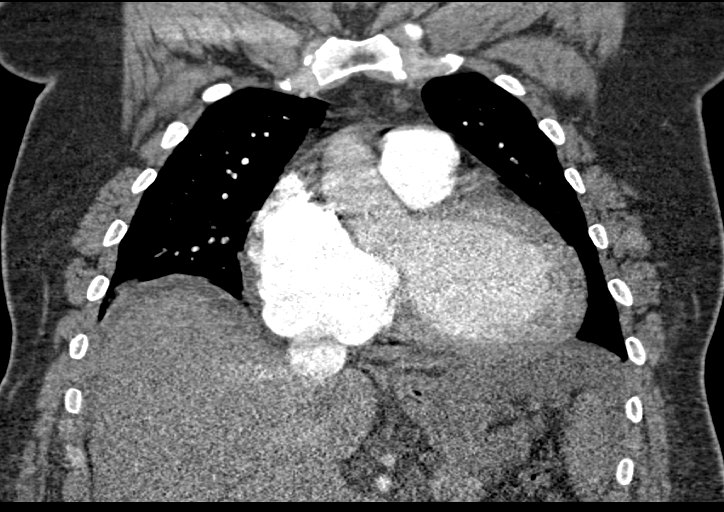
[im 127/169  soft-tissue]
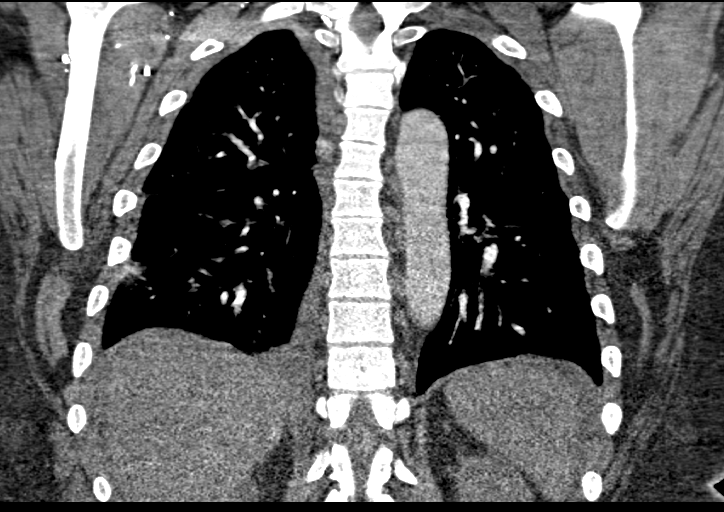

[17 of 46 positions shown; findings below may reference images not displayed]

FINDINGS: Cardiovascular: There is no demonstrable pulmonary embolus. There is
no thoracic aortic aneurysm or dissection. The visualized great
vessels appear normal. There is cardiomegaly. There is no
pericardial effusion or pericardial thickening.

The main pulmonary outflow tract measures 3.6 cm, abnormally
prominent.

Mediastinum/Nodes: Visualized thyroid appears unremarkable. There
are multiple subcentimeter mediastinal lymph nodes. There is a right
pretracheal lymph node measuring 1.2 x 1.2 cm. No esophageal lesions
are evident.

Lungs/Pleura: There is a moderate free-flowing right pleural
effusion. There is atelectatic change in the right base. There is a
nodular appearing opacity abutting the pleura in the right lower
lobe measuring 2.1 x 2.1 cm, best appreciated on axial slice 79
series 6, coronal slice 125 series 9, and sagittal slice 63 series
10. right lung is clear.

Upper Abdomen: There is reflux of contrast into the inferior vena
cava and hepatic veins. Visualized upper abdominal structures
otherwise appear unremarkable.

Musculoskeletal: No blastic or lytic bone lesions. No evident chest
wall lesions.

Review of the MIP images confirms the above findings.
IMPRESSION: 1. No demonstrable pulmonary embolus. No thoracic aortic aneurysm or
dissection.

2. Enlarged main pulmonary outflow tract, a finding indicative of
pulmonary arterial hypertension.

3. Cardiomegaly. Reflux of contrast into the inferior vena cava and
hepatic veins may well represent a degree of increase in right heart
pressure.

4.  Right pleural effusion.  Right base atelectasis.

5. 2.1 x 2.1 cm nodular appearing opacity abutting the pleura in the
right lower lobe. Suspect rounded atelectasis. Atypical pneumonia is
a differential consideration. Neoplasm is a third differential
consideration. Consider one of the following in 3 months for both
low-risk and high-risk individuals: (a) repeat chest CT, (b)
follow-up PET-CT, or (c) tissue sampling. This recommendation
follows the consensus statement: Guidelines for Management of
Incidental Pulmonary Nodules Detected on CT Images: From the
reasonable to proceed with PET-CT at this time given this nodular
opacity as well as prominent right pretracheal lymph node.

6. Mildly enlarged right pretracheal lymph node. No other adenopathy
by size criteria.

## 2019-08-12 IMAGING — CR CHEST - 2 VIEW
2 series · 2 of 2 positions shown · non-contrast
Comparison: 03/27/2013

CLINICAL DATA: Cough and shortness of breath.  Left arm tingling.

EXAM:
CHEST - 2 VIEW

[w chest pa]
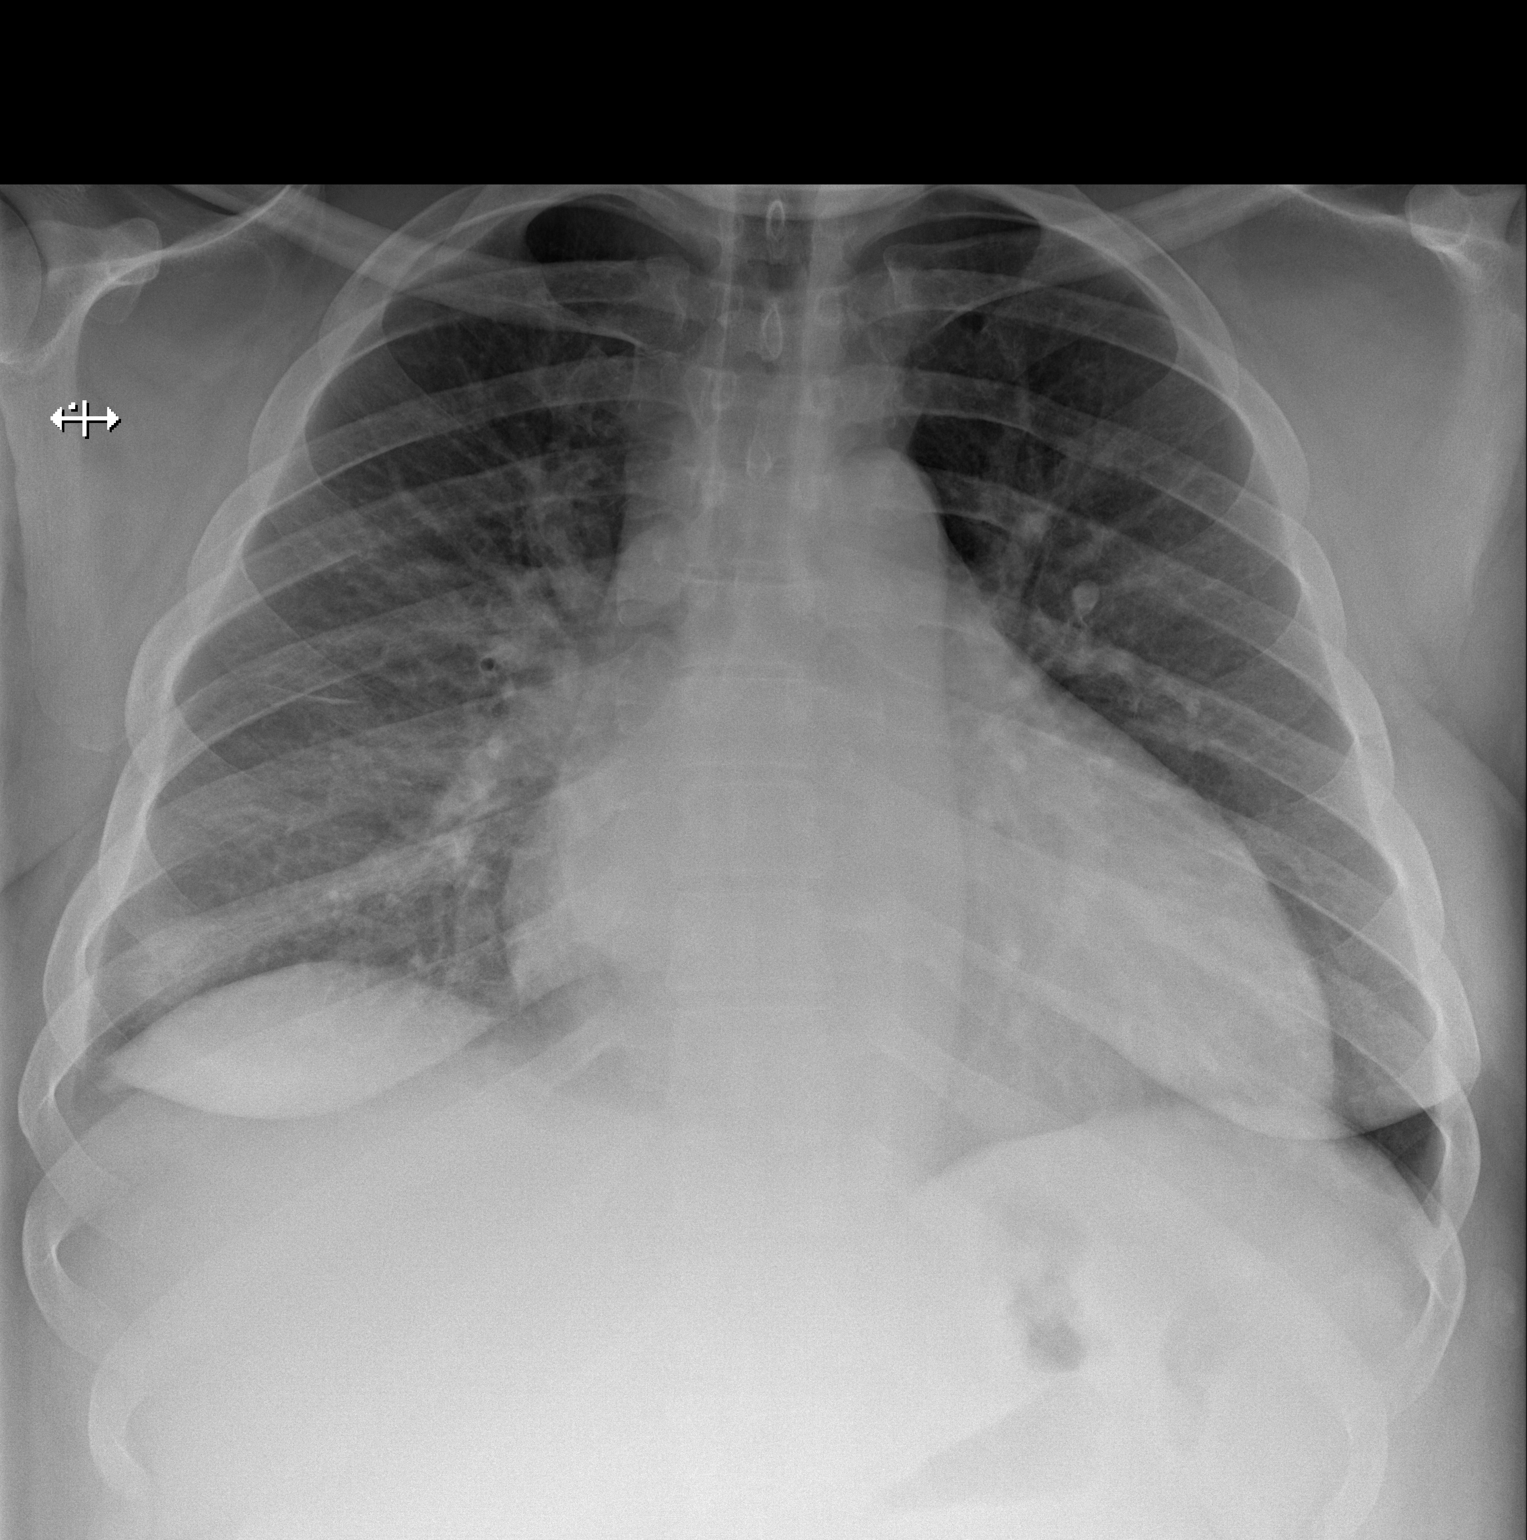

[w chest lat]
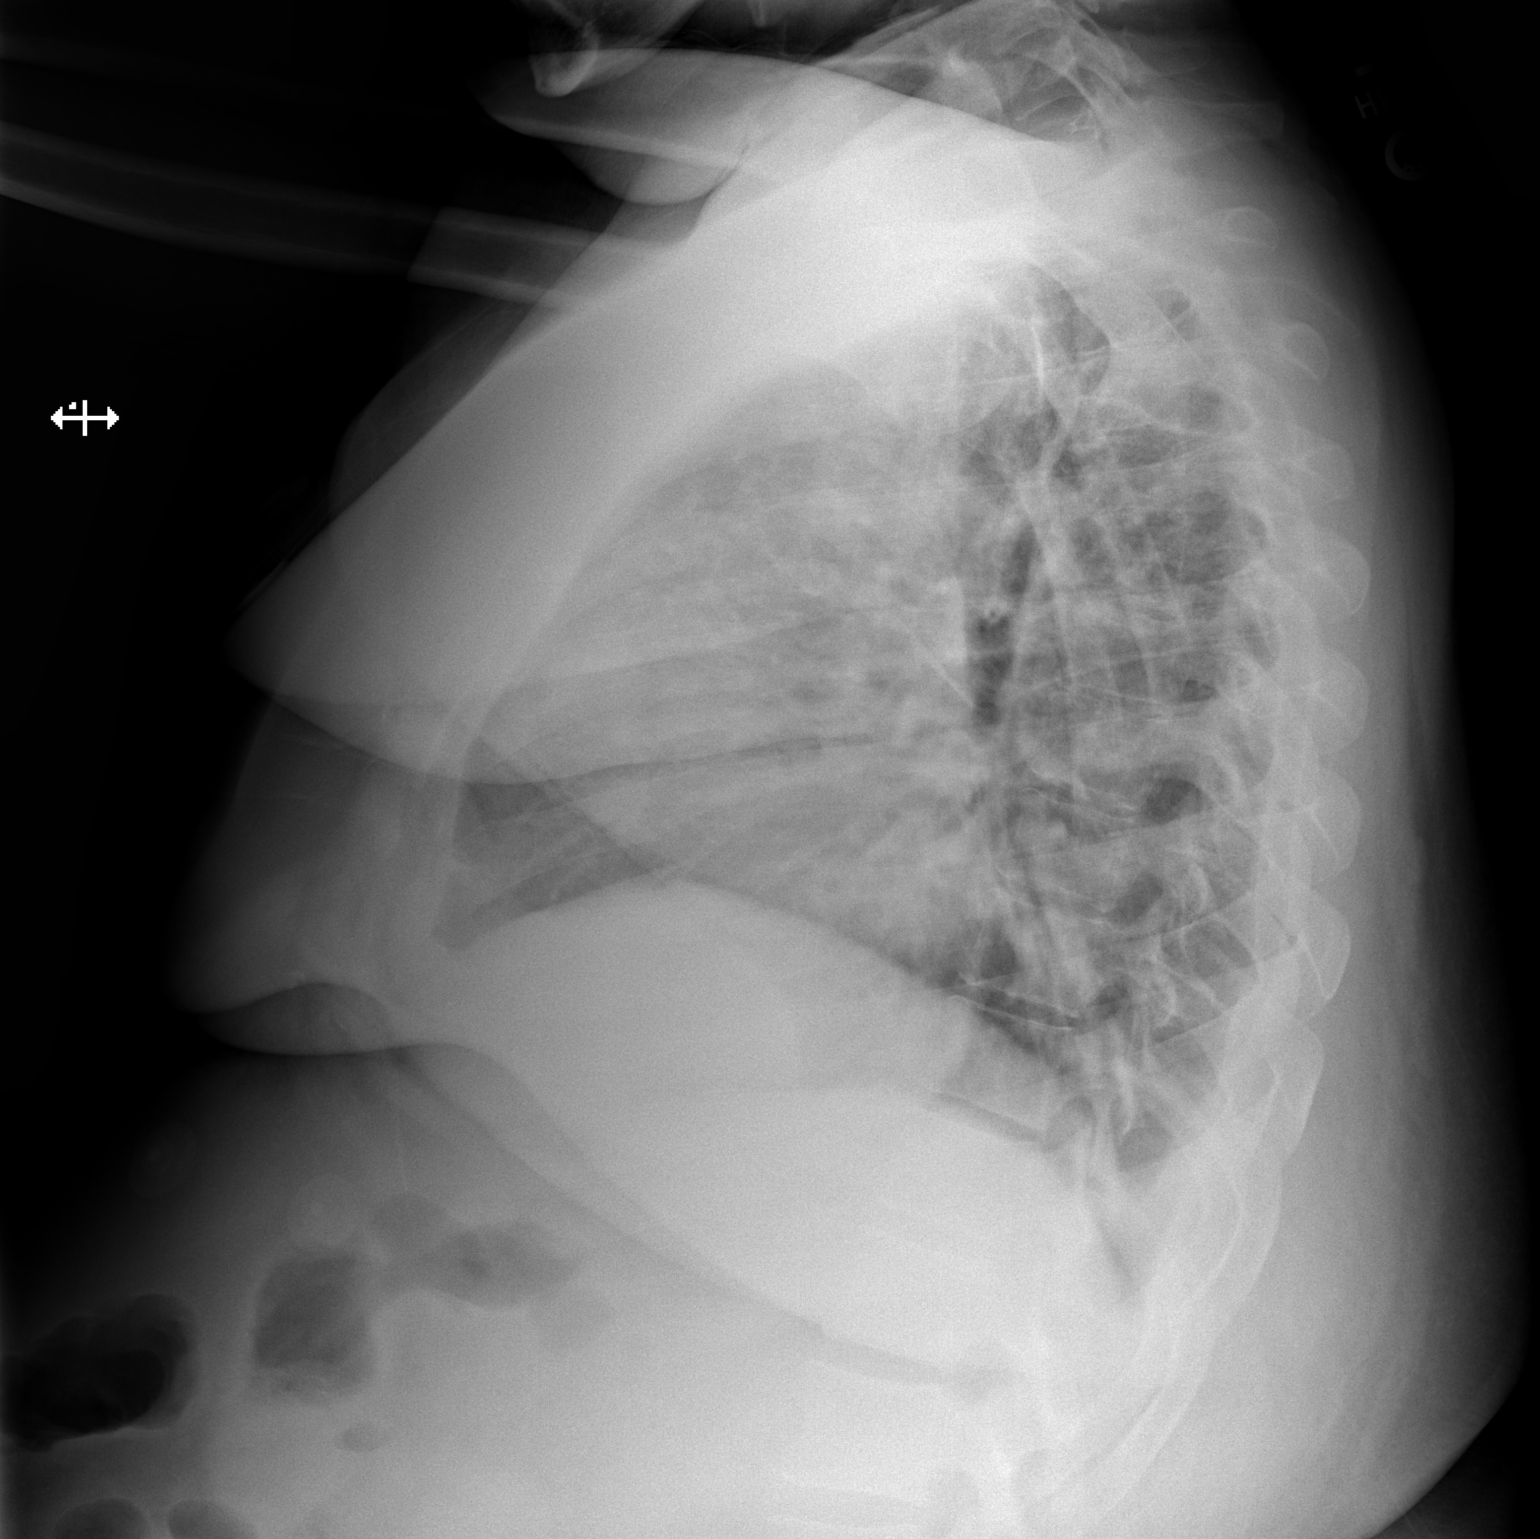

[2 of 2 positions shown; findings below may reference images not displayed]

FINDINGS: Cardiac silhouette is mildly enlarged. No mediastinal or hilar
masses. No evidence of adenopathy.

Lungs are clear.  No pleural effusion or pneumothorax.

Skeletal structures are unremarkable.
IMPRESSION: 1. No acute cardiopulmonary disease.
2. Mild cardiomegaly.

## 2019-08-13 IMAGING — US CHEST ULTRASOUND
1 series · 4 of 4 positions shown · non-contrast
Comparison: CTA of the chest on 05/02/2019

CLINICAL DATA: Right pleural effusion.

EXAM:
CHEST ULTRASOUND

[Series 1: chest ultrasound · 4 acquisitions, 4 frames shown]
[im 1/4]
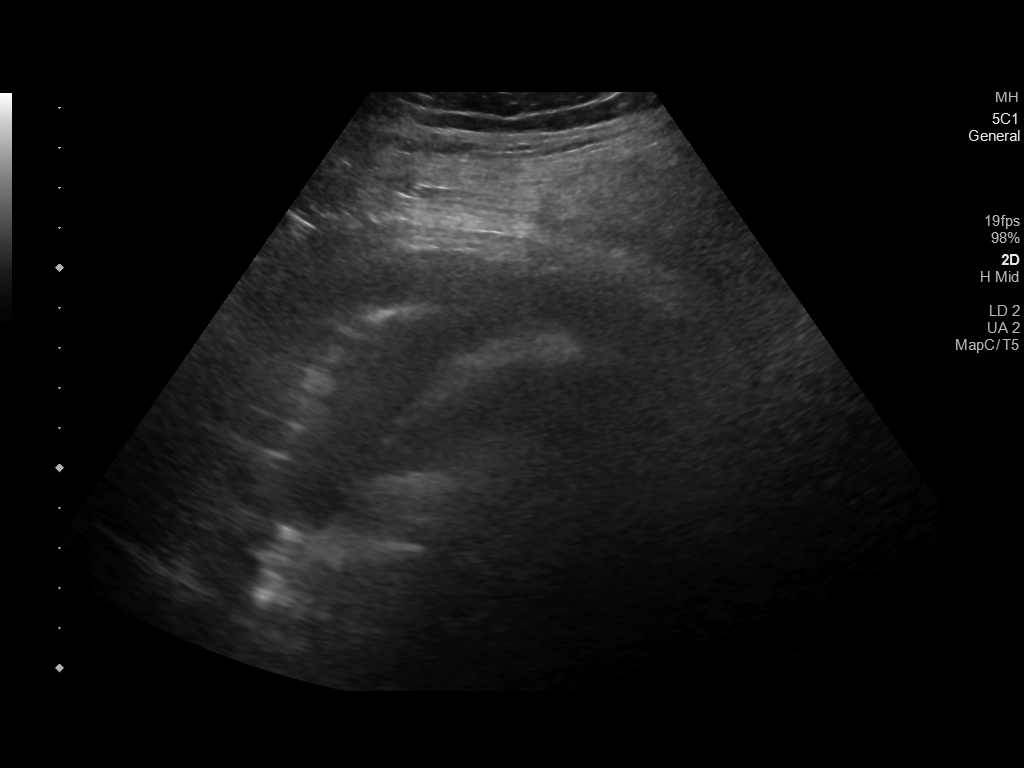
[im 2/4]
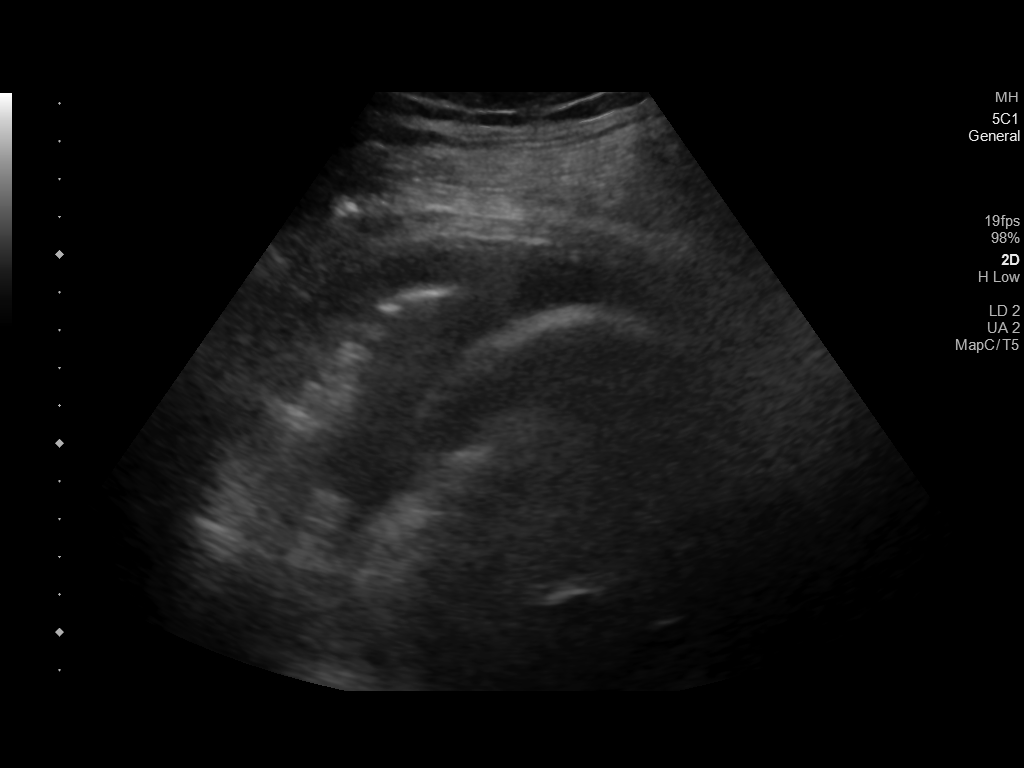
[im 3/4]
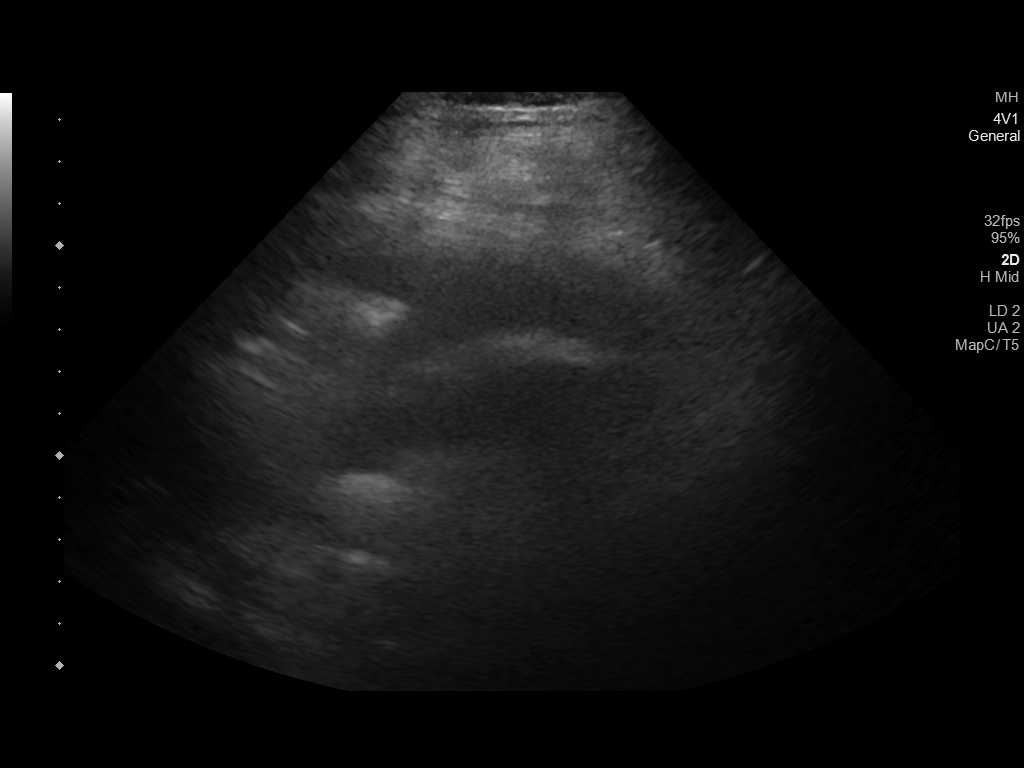
[im 4/4]
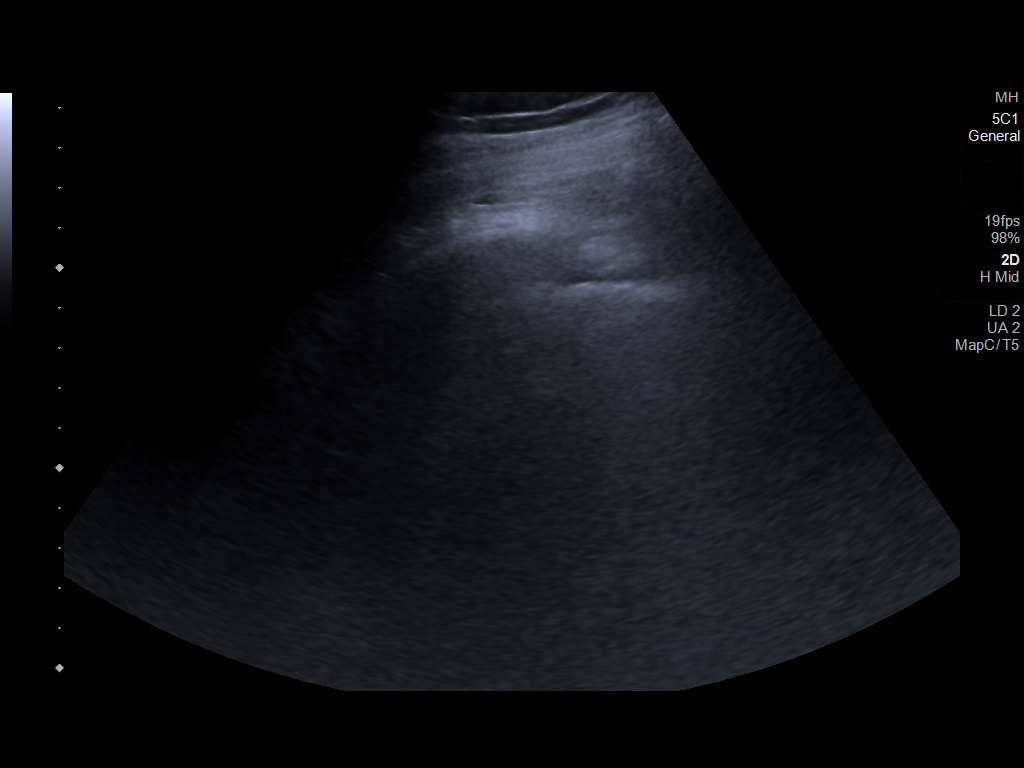

[4 of 4 positions shown; findings below may reference images not displayed]

FINDINGS: By ultrasound there is only a very small amount of pleural fluid in
the posterior right pleural space. There was not enough fluid
present to allow for safe thoracentesis.
IMPRESSION: Small right pleural effusion by ultrasound. There was not enough
fluid present to allow for safe thoracentesis.

## 2019-08-14 IMAGING — DX PORTABLE CHEST - 1 VIEW
1 series · 1 of 1 positions shown · non-contrast
Comparison: CT 05/02/2019.  Chest x-ray 05/02/2019.

CLINICAL DATA: Shortness of breath.

EXAM:
PORTABLE CHEST 1 VIEW

[chest ap]
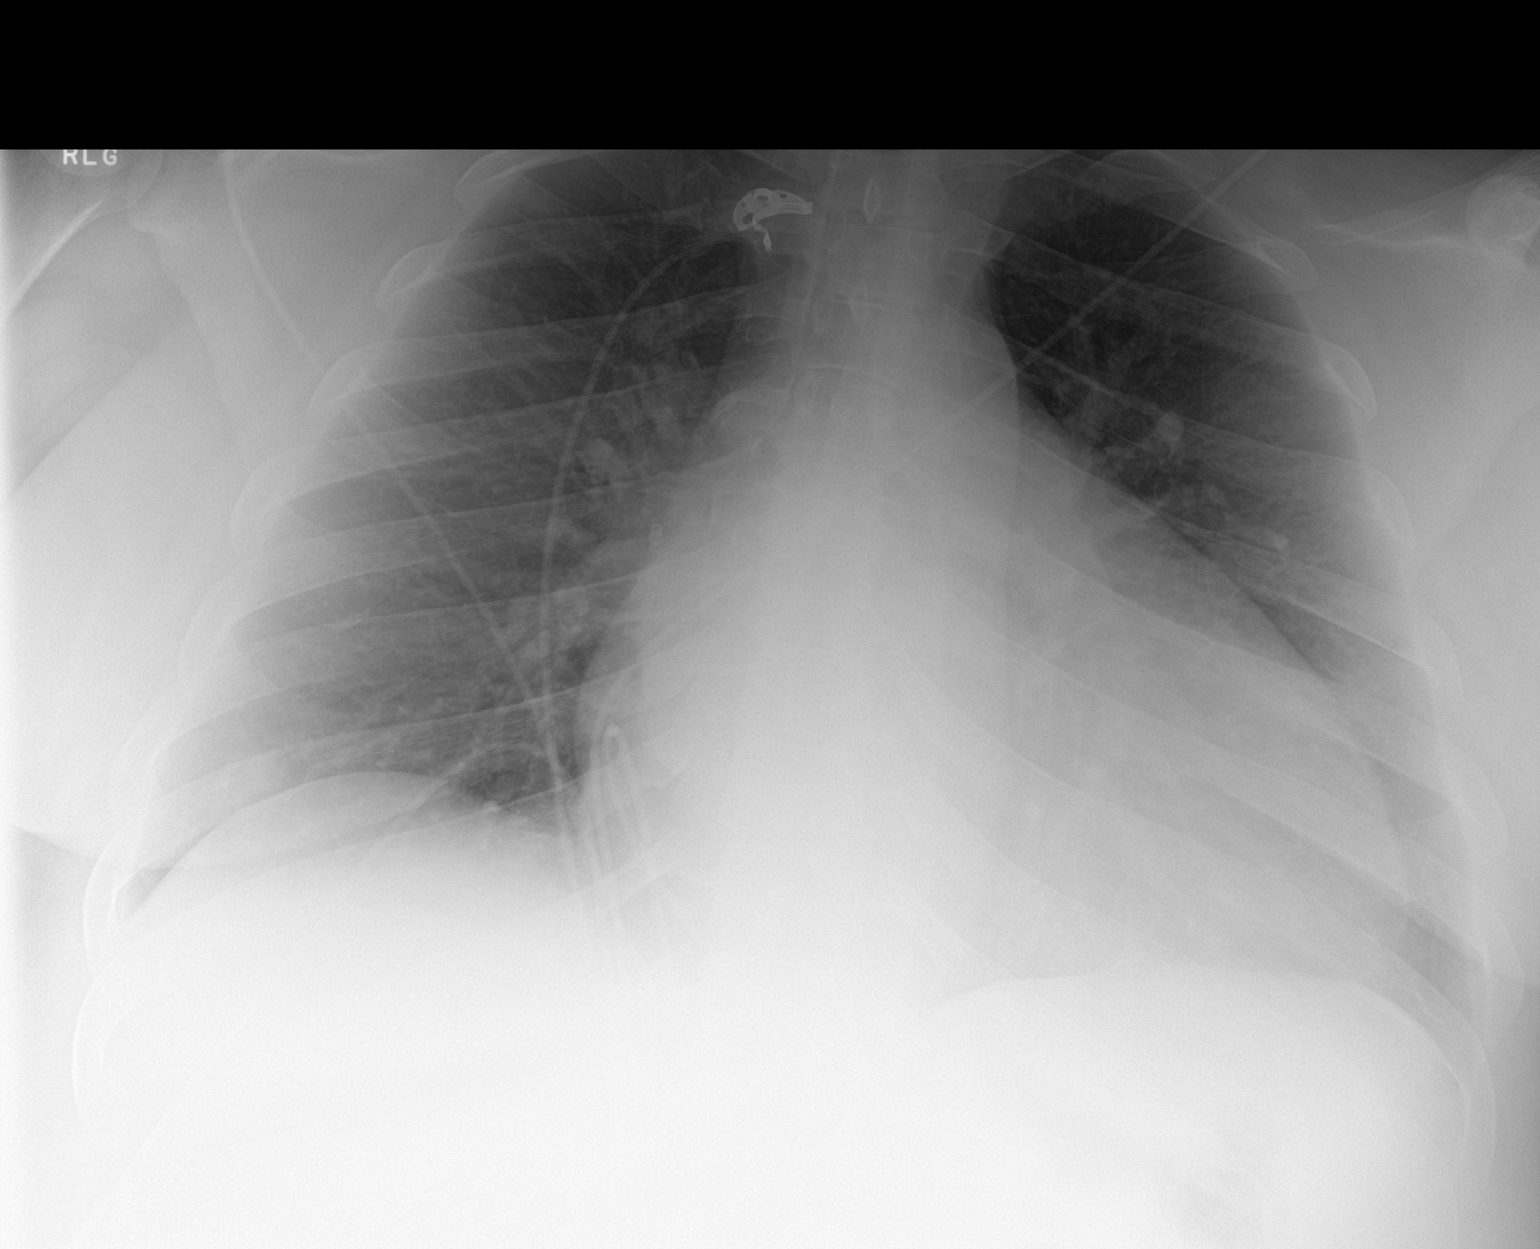

[1 of 1 positions shown; findings below may reference images not displayed]

FINDINGS: Cardiomegaly. Partial clearing of interstitial prominence suggesting
clearing interstitial edema. No pleural effusion or pneumothorax.
Identified peripheral density over the right lung base best
identified by prior CT. Reference is made to recent CT report.
IMPRESSION: 1. Severe cardiomegaly again noted. Partial clearing of mild
bilateral interstitial prominence consistent with clearing CHF.

2. Previously identified rounded pleural-based density in the right
best identified by prior CT. Rest of this is made to prior CT
report.
